# Patient Record
Sex: Female | Born: 1945
Health system: Southern US, Community
[De-identification: ages and names within clinical notes are randomized; demographics above are authoritative.]

## PROBLEM LIST (undated history)

## (undated) DIAGNOSIS — E039 Hypothyroidism, unspecified: Secondary | ICD-10-CM

## (undated) DIAGNOSIS — R55 Syncope and collapse: Secondary | ICD-10-CM

## (undated) DIAGNOSIS — M109 Gout, unspecified: Secondary | ICD-10-CM

## (undated) DIAGNOSIS — E878 Other disorders of electrolyte and fluid balance, not elsewhere classified: Secondary | ICD-10-CM

## (undated) DIAGNOSIS — I10 Essential (primary) hypertension: Secondary | ICD-10-CM

## (undated) DIAGNOSIS — E079 Disorder of thyroid, unspecified: Secondary | ICD-10-CM

## (undated) DIAGNOSIS — E7800 Pure hypercholesterolemia, unspecified: Secondary | ICD-10-CM

## (undated) HISTORY — PX: CHOLECYSTECTOMY: SHX55

## (undated) HISTORY — PX: ABDOMINAL HYSTERECTOMY: SHX81

## (undated) HISTORY — PX: TUBAL LIGATION: SHX77

## (undated) HISTORY — PX: APPENDECTOMY: SHX54

---

## 1998-10-10 ENCOUNTER — Other Ambulatory Visit: Admission: RE | Admit: 1998-10-10 | Discharge: 1998-10-10 | Payer: Self-pay | Admitting: Family Medicine

## 1998-10-30 ENCOUNTER — Ambulatory Visit (HOSPITAL_COMMUNITY): Admission: RE | Admit: 1998-10-30 | Discharge: 1998-10-30 | Payer: Self-pay | Admitting: Gastroenterology

## 2000-07-07 ENCOUNTER — Other Ambulatory Visit: Admission: RE | Admit: 2000-07-07 | Discharge: 2000-07-07 | Payer: Self-pay | Admitting: Family Medicine

## 2001-12-20 ENCOUNTER — Other Ambulatory Visit: Admission: RE | Admit: 2001-12-20 | Discharge: 2001-12-20 | Payer: Self-pay | Admitting: Family Medicine

## 2002-01-04 ENCOUNTER — Encounter: Payer: Self-pay | Admitting: Family Medicine

## 2002-01-04 ENCOUNTER — Ambulatory Visit (HOSPITAL_COMMUNITY): Admission: RE | Admit: 2002-01-04 | Discharge: 2002-01-04 | Payer: Self-pay | Admitting: Family Medicine

## 2002-10-07 ENCOUNTER — Emergency Department (HOSPITAL_COMMUNITY): Admission: EM | Admit: 2002-10-07 | Discharge: 2002-10-07 | Payer: Self-pay | Admitting: Emergency Medicine

## 2003-03-22 ENCOUNTER — Other Ambulatory Visit: Admission: RE | Admit: 2003-03-22 | Discharge: 2003-03-22 | Payer: Self-pay | Admitting: Family Medicine

## 2003-04-11 ENCOUNTER — Encounter: Admission: RE | Admit: 2003-04-11 | Discharge: 2003-04-11 | Payer: Self-pay | Admitting: Family Medicine

## 2003-04-11 ENCOUNTER — Encounter: Payer: Self-pay | Admitting: Family Medicine

## 2004-04-02 ENCOUNTER — Other Ambulatory Visit: Admission: RE | Admit: 2004-04-02 | Discharge: 2004-04-02 | Payer: Self-pay | Admitting: Family Medicine

## 2005-04-28 ENCOUNTER — Other Ambulatory Visit: Admission: RE | Admit: 2005-04-28 | Discharge: 2005-04-28 | Payer: Self-pay | Admitting: Family Medicine

## 2006-09-04 ENCOUNTER — Emergency Department (HOSPITAL_COMMUNITY): Admission: EM | Admit: 2006-09-04 | Discharge: 2006-09-04 | Payer: Self-pay | Admitting: Emergency Medicine

## 2008-10-23 ENCOUNTER — Other Ambulatory Visit: Admission: RE | Admit: 2008-10-23 | Discharge: 2008-10-23 | Payer: Self-pay | Admitting: Family Medicine

## 2009-03-19 ENCOUNTER — Inpatient Hospital Stay (HOSPITAL_COMMUNITY): Admission: EM | Admit: 2009-03-19 | Discharge: 2009-03-20 | Payer: Self-pay | Admitting: Surgery

## 2009-03-19 ENCOUNTER — Encounter: Admission: RE | Admit: 2009-03-19 | Discharge: 2009-03-19 | Payer: Self-pay | Admitting: Family Medicine

## 2009-03-19 ENCOUNTER — Encounter (INDEPENDENT_AMBULATORY_CARE_PROVIDER_SITE_OTHER): Payer: Self-pay | Admitting: Surgery

## 2009-12-03 ENCOUNTER — Ambulatory Visit: Payer: Self-pay | Admitting: Gastroenterology

## 2009-12-03 DIAGNOSIS — E049 Nontoxic goiter, unspecified: Secondary | ICD-10-CM | POA: Insufficient documentation

## 2009-12-03 DIAGNOSIS — R634 Abnormal weight loss: Secondary | ICD-10-CM | POA: Insufficient documentation

## 2009-12-04 ENCOUNTER — Encounter: Payer: Self-pay | Admitting: Gastroenterology

## 2009-12-19 ENCOUNTER — Encounter (HOSPITAL_COMMUNITY): Admission: RE | Admit: 2009-12-19 | Discharge: 2010-01-18 | Payer: Self-pay | Admitting: Family Medicine

## 2009-12-21 DIAGNOSIS — R630 Anorexia: Secondary | ICD-10-CM | POA: Insufficient documentation

## 2010-01-29 LAB — CONVERTED CEMR LAB
Free T4: 0.73 ng/dL — ABNORMAL LOW (ref 0.80–1.80)
TSH: 21.829 microintl units/mL — ABNORMAL HIGH (ref 0.350–4.500)

## 2010-03-05 ENCOUNTER — Encounter (INDEPENDENT_AMBULATORY_CARE_PROVIDER_SITE_OTHER): Payer: Self-pay

## 2010-04-29 ENCOUNTER — Encounter: Admission: RE | Admit: 2010-04-29 | Discharge: 2010-04-29 | Payer: Self-pay | Admitting: Endocrinology

## 2010-05-21 ENCOUNTER — Ambulatory Visit: Payer: Self-pay | Admitting: Gastroenterology

## 2010-05-21 ENCOUNTER — Telehealth (INDEPENDENT_AMBULATORY_CARE_PROVIDER_SITE_OTHER): Payer: Self-pay

## 2010-05-21 DIAGNOSIS — E039 Hypothyroidism, unspecified: Secondary | ICD-10-CM | POA: Insufficient documentation

## 2010-05-30 ENCOUNTER — Ambulatory Visit: Payer: Self-pay | Admitting: Gastroenterology

## 2010-05-30 ENCOUNTER — Ambulatory Visit (HOSPITAL_COMMUNITY)
Admission: RE | Admit: 2010-05-30 | Discharge: 2010-05-30 | Payer: Self-pay | Source: Home / Self Care | Admitting: Gastroenterology

## 2010-09-14 ENCOUNTER — Encounter: Payer: Self-pay | Admitting: Family Medicine

## 2010-09-23 NOTE — Progress Notes (Signed)
Summary: phone note  Phone Note Outgoing Call   Summary of Call: Called pt and LMOM she needs TSH before TCS. Need to know where to fax the order to. Please call. Initial call taken by: Cloria Spring LPN,  May 21, 2010 4:53 PM     Appended Document: phone note tried to call pt- LM asking that she go by [facility] and have labs done  Appended Document: phone note pt aware and will go have lab done

## 2010-09-23 NOTE — Assessment & Plan Note (Signed)
Summary: ANOREXIA, WEIGHT LOSS, FHx: COLON CA-sister   Visit Type:  Initial Consult Referring Provider:  Renaye Rakers Primary Care Provider:  Renaye Rakers, M.D.  Chief Complaint:  TCS/ hemorrhoids.  History of Present Illness: No rectal bleeding, change in bowel habits, or diarrhea. Sister died w/ Colon CA ?<60. Prior TCS: no problems.   Preventive Screening-Counseling & Management  Alcohol-Tobacco     Smoking Status: never  Current Medications (verified): 1)  Crestor .... Take 1 Tablet By Mouth Once A Day 2)  Aleve .... As Needed 3)  Aciphex 20 Mg Tbec (Rabeprazole Sodium) .... Take 1 Tablet By Mouth Once A Day  Allergies (verified): No Known Drug Allergies  Past History:  Past Medical History: GERD Hyperlipidemia Gout  Past Surgical History: Appendectomy age 26 Hysterectomy: DUB Tubal Ligation Cholecystectomy: pain and ?stones-in [place] 2010  Family History: FH of Colon Cancer: sister  CAD, Heart, DM  neg: thyroid problems  Social History: Divoreced: 3 kids, youngest 63 Occupation: Engineer, civil (consulting), [employer] Patient has never smoked.  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  12 LB weight loss over last year. FEB 2011: 129 lbs Decreased appetite since GB surgery.  MAR 2011: CR 0.93, NL HFP, ALB 4.7 HB 12 PLT 313  Vital Signs:  Patient profile:   65 year old female Height:      63 inches Weight:      128 pounds BMI:     22.76 Temp:     98.4 degrees F oral Pulse rate:   72 / minute BP sitting:   120 / 80  (left arm) Cuff size:   regular  Vitals Entered By: Cloria Spring LPN (December 03, 2009 3:27 PM)  Physical Exam  General:  Well developed, well nourished, no acute distress. Head:  Normocephalic and atraumatic. Eyes:  PERRLA, no icterus. Mouth:  No deformity or lesions. Neck:  Supple; no masses, thyromegaly bil lobes, nontender. Lungs:  Clear  throughout to auscultation. Heart:  Regular rate and rhythm; no murmurs. Abdomen:  Soft, nontender and nondistended. Normal bowel sounds. Msk:  Symmetrical with no gross deformities. Normal posture. Extremities:  No edema  noted. Neurologic:  Alert and  oriented x4;  grossly normal neurologically.  Impression & Recommendations:  Problem # 1:  WEIGHT LOSS (ICD-783.21) Anorexia and first degree relative with colon CA. TCS > 10 yrs ago. TCS APR 29-SUPREP. Asked pt to follow up in 2-3 weeks with Dr. Parke Simmers RE: enlarged thyroid, weight loss, and anorexia. Will fax thyroid tests to Dr. Parke Simmers. OPV as needed.  Orders: T-TSH ([id number]) T-Free T4 (23300) Consultation Level III (73220)  CC: PCP

## 2010-09-23 NOTE — Letter (Signed)
Summary: TCS ORDER  TCS ORDER   Imported By: Ave Filter 05/21/2010 16:29:09  _____________________________________________________________________  External Attachment:    Type:   Image     Comment:   External Document

## 2010-09-23 NOTE — Assessment & Plan Note (Signed)
Summary: CRCS, HYPOTHYROIDISM   Visit Type:  Follow-up Visit Primary Care Kumiko Fishman:  Parke Simmers, M.D.  Chief Complaint:  F/U consult for TCS.  History of Present Illness: No rectal bleeding. Feels better and states thyroid is under good control after starting Synthroid. Doesn't know if she was treated for H. pylori. Weight stable: 126-128 lbs.  Current Medications (verified): 1)  Crestor .... Take 1 Tablet By Mouth Once A Day 2)  Aleve .... As Needed 3)  Synthroid .... Take 1 Tablet By Mouth Once A Day  Allergies (verified): No Known Drug Allergies  Past History:  Past Surgical History: Last updated: 12/03/2009 Appendectomy age 68 Hysterectomy: DUB Tubal Ligation Cholecystectomy: pain and ?stones-in [place] 2010  Past Medical History: GERD Hyperlipidemia Gout POS H. PYLORI SEROLOGY, RX: PREVPAC Hypothyroidism, LAST TSH- 23.171 APR 2011, -->Synthroid dose?  Vital Signs:  Patient profile:   65 year old female Height:      63 inches Weight:      126 pounds BMI:     22.40 Temp:     97.9 degrees F oral Pulse rate:   80 / minute BP sitting:   110 / 72  (left arm) Cuff size:   regular  Vitals Entered By: Cloria Spring LPN (May 21, 2010 3:50 PM)  Physical Exam  General:  Well developed, well nourished, no acute distress. Head:  Normocephalic and atraumatic. Lungs:  Clear throughout to auscultation. Heart:  Regular rate and rhythm; no murmurs.  Impression & Recommendations:  Problem # 1:  SCREENING, COLORECTAL CANCER (ICD-V76.51) TCS OCT 7 SUPREP.  Problem # 2:  HYPOTHYROIDISM (ICD-244.9) Pt stated she had thyroid test rechecked after starting Synthroid. Called PCP. No repeat TSH performed. Synthroid dose?  Orders: T-TSH [phone])  Appended Document: Orders Update    Clinical Lists Changes  Orders: Added new Service order of Est. Patient Level II (51884) - Signed

## 2010-09-23 NOTE — Letter (Signed)
Summary: TCS ORDER  TCS ORDER   Imported By: Ave Filter 12/03/2009 16:26:17  _____________________________________________________________________  External Attachment:    Type:   Image     Comment:   External Document

## 2010-09-23 NOTE — Letter (Signed)
Summary: Recall Office Visit  [facility]  [address]   [place], [place] [address]   Phone: [phone]  Fax: [phone]      March 05, 2010   MELYSSA SIGNOR [address] [place], [place]  [address] 1946/05/04   Dear Ms. Keleher,   According to our records, it is time for you to schedule a follow-up office visit with [place].   At your convenience, please call [phone] to schedule an office visit. If you have any questions, concerns, or feel that this letter is in error, we would appreciate your call.   Sincerely,    Hendricks Limes LPN  [facility] Ph: [phone]   Fax: [phone]

## 2010-10-29 ENCOUNTER — Encounter (INDEPENDENT_AMBULATORY_CARE_PROVIDER_SITE_OTHER): Payer: Self-pay | Admitting: *Deleted

## 2010-11-04 NOTE — Letter (Signed)
Summary: Recall Office Visit  [facility]  [address]   [place], [place] [address]   Phone: [phone]  Fax: [phone]      October 29, 2010   SHERELL CHRISTOFFEL [address] [place], [place]  [address] 08-13-1946   Dear Ms. Iannaccone,   According to our records, it is time for you to schedule a follow-up office visit with [place].   At your convenience, please call [phone] to schedule an office visit. If you have any questions, concerns, or feel that this letter is in error, we would appreciate your call.   Sincerely,    Diana Eves  [facility] Ph: [phone]   Fax: [phone]

## 2010-11-05 ENCOUNTER — Other Ambulatory Visit: Payer: Self-pay | Admitting: Family Medicine

## 2010-11-05 ENCOUNTER — Other Ambulatory Visit (HOSPITAL_COMMUNITY)
Admission: RE | Admit: 2010-11-05 | Discharge: 2010-11-05 | Disposition: A | Discharge status: Home / Self Care | Payer: BC Managed Care – PPO | Source: Ambulatory Visit | Attending: Family Medicine | Admitting: Family Medicine

## 2010-11-05 DIAGNOSIS — Z01419 Encounter for gynecological examination (general) (routine) without abnormal findings: Secondary | ICD-10-CM | POA: Insufficient documentation

## 2010-11-13 ENCOUNTER — Other Ambulatory Visit (HOSPITAL_COMMUNITY): Payer: Self-pay | Admitting: Family Medicine

## 2010-11-13 DIAGNOSIS — Z139 Encounter for screening, unspecified: Secondary | ICD-10-CM

## 2010-11-18 ENCOUNTER — Ambulatory Visit (HOSPITAL_COMMUNITY): Payer: BC Managed Care – PPO

## 2010-11-30 LAB — CREATININE, SERUM
Creatinine, Ser: 0.91 mg/dL (ref 0.4–1.2)
GFR calc Af Amer: >60 mL/min (ref >60)

## 2010-11-30 LAB — CBC
MCHC: 34.7 g/dL (ref 30.0–36.0)
RDW: 14.2 % (ref 11.5–15.5)

## 2010-12-02 ENCOUNTER — Other Ambulatory Visit (HOSPITAL_COMMUNITY): Payer: Self-pay | Admitting: Family Medicine

## 2010-12-02 DIAGNOSIS — Z780 Asymptomatic menopausal state: Secondary | ICD-10-CM

## 2010-12-05 ENCOUNTER — Ambulatory Visit (HOSPITAL_COMMUNITY)
Admission: RE | Admit: 2010-12-05 | Discharge: 2010-12-05 | Disposition: A | Discharge status: Home / Self Care | Payer: BC Managed Care – PPO | Source: Ambulatory Visit | Attending: Family Medicine | Admitting: Family Medicine

## 2010-12-05 ENCOUNTER — Encounter (HOSPITAL_COMMUNITY): Payer: Self-pay

## 2010-12-05 DIAGNOSIS — Z1231 Encounter for screening mammogram for malignant neoplasm of breast: Secondary | ICD-10-CM | POA: Insufficient documentation

## 2010-12-05 DIAGNOSIS — M818 Other osteoporosis without current pathological fracture: Secondary | ICD-10-CM | POA: Insufficient documentation

## 2010-12-05 DIAGNOSIS — Z139 Encounter for screening, unspecified: Secondary | ICD-10-CM

## 2010-12-05 DIAGNOSIS — Z780 Asymptomatic menopausal state: Secondary | ICD-10-CM

## 2011-01-06 NOTE — Op Note (Signed)
Jennifer Mullen, Jennifer Mullen NO.:  [id number]   MEDICAL RECORD NO.:  [id number]          PATIENT TYPE:  INP   LOCATION:  1536                         FACILITY:  [facility]   PHYSICIAN:  Ardeth Sportsman, MD     DATE OF BIRTH:  August 07, 1946   DATE OF PROCEDURE:  03/19/2009  DATE OF DISCHARGE:                               OPERATIVE REPORT   PRIMARY CARE:  Renaye Rakers, M.D.   SURGEON:  Karie Soda, M.D.   ASSISTANT:  RN.   PREOPERATIVE DIAGNOSIS:  Acute cholecystitis with cholecystolithiasis.   POSTOPERATIVE DIAGNOSIS:  Acute cholecystitis with cholecystolithiasis.   PROCEDURE PERFORMED:  Laparoscopic cholecystectomy with intraoperative  cholangiogram.   ANESTHESIA:  1. General anesthesia.  2. Local anesthetic in a field block around all port sites.   SPECIMENS:  Gallbladder.   DRAINS:  None.   ESTIMATED BLOOD LOSS:  20 mL.   COMPLICATIONS:  None apparent.   INDICATIONS:  Jennifer Mullen is a 65 year old female who has had a 3-day  history of abdominal pain in the right upper quadrant that has not  abated.  She saw her primary care physician, Dr. Parke Simmers, who did a CAT  scan and physical exam was concerning for cholecystitis.  She requested  surgical admission.   The anatomy and physiology of hepatobiliary and pancreatic function was  discussed.  Pathophysiology of cholecystitis was discussed.  Options  discussed and recommendation made for laparoscopic cholecystectomy with  intraoperative cholangiogram.  Risks, benefits, and alternatives  discussed.  Questions answered.  She agreed to proceed.   OPERATIVE FINDINGS:  She had a turgid gallbladder that is consistent  with early acute cholecystitis.  She had some mild gallbladder wall  thickening.  Her cystic duct was rather tortuous.  Her cholangiogram  showed no evidence of obstruction.  She had some mild liver adhesions  but no other major problems.   DESCRIPTION OF PROCEDURE:  Informed consent was confirmed.  The  patient  was already on IV Unasyn.  She voided prior to coming to the operating  room.  She underwent general anesthesia without any difficulty.  She was  positioned supine with arms out.  Her abdomen was prepped and draped in  sterile fashion.  Surgical time-out confirmed our plan.   A number 5 mm port was placed in the right upper quadrant using optical  entry technique with the patient in steep reversed Trendelenburg and  right-side up.  Camera inspection revealed no intra-abdominal injury.  Under direct visualization, 5 mm ports placed in the subxiphoid region  and right flank.  A 10 mm port was placed through the umbilicus.   Camera inspection revealed no intra-abdominal injury.  The gallbladder  was too distended to safely be grasped.  Therefore I did needle  decompression and aspirated some bile with slightly milky.  The  gallbladder could better be grasped, elevated cephalad.  Peritoneal  coverage between the gallbladder and liver were freed on the anterior  anteromedial and posterolateral walls.  Circumferential dissection was  done to free the proximal third of the gallbladder off the liver bed to  get  a good critical view.  The anterior-posterior branches of the cystic  artery could be seen climbing up on the gallbladder wall.  These were  carefully skeletonized and clipped with one clip on the gallbladder side  and two clips slightly proximal.  Further skeletonization was done until  only the infundibulum and cystic duct was seen.  She had somewhat of a  tortuous short cystic duct but with careful skeletonization and  dissection I was able to straighten out the cystic duct better.  Two  clips were placed in the infundibulum and a partial cystic ductotomy was  performed.  There was a moderate medium sized stone right at the  infundibulum.  I opened up the ductotomy little bit more and got the  stone out.  The clips in the infundibulum fell off so I removed them.   A #5  cholangiogram catheter was passed through a subcostal puncture site  and into the cystic duct.  Cholangiogram was run using dilute radio-  opaque contrast and continuous fluoroscopy.  Contrast flowed through a  short slightly dilated cystic duct.  It refluxed up the common hepatic  duct into right and left intrahepatic chains and came down the short  common bile duct across a normal ampulla into the duodenum without any  difficulty.  This was consistent with a classic anatomy with otherwise  normal cholangiogram.   The cholangiocatheter was removed.  I placed five clips on the cystic  duct since a couple did get completely across but I had at least three  good ones going across it.  A cystic duct transection was completed.  The gallbladder was freed from its remaining attachments on the liver  bed and I removed it inside an EndoCatch bag and pulled it out the  umbilicus with some gentle dilation.  She had obvious stones.  Gallbladder was moderately thickened so it was hard to get it completely  out.   I did camera inspection and made sure hemostasis was noted on the liver  bed.  Clips were intact in the cystic duct and arterial stumps.  Copious  irrigation of a liter was done with clear return at the end.  Capnoperitoneum was evacuated and ports removed.  The umbilical fascial  defect was closed using 0-0 Vicryl interrupted stitches.  A finger sweep  made sure there was no breech any intra-abdominal contents before tying  stitches down.  Skin was closed using 4-0 Monocryl stitch.  Sterile  dressings applied.   The patient extubated and sent to recovery room in stable condition.  I  discussed postop care with the patient and her friend as well.  I  discussed it with the patient's friend after surgery as well and she  expressed understanding and appreciation.      Ardeth Sportsman, MD  Electronically Signed     SCG/MEDQ  D:  03/19/2009  T:  03/20/2009  Job:  [id number]   cc:   Renaye Rakers, M.D.  Fax: [phone]

## 2011-01-06 NOTE — H&P (Signed)
Jennifer Mullen, Jennifer Mullen NO.:  [id number]   MEDICAL RECORD NO.:  [id number]          PATIENT TYPE:  INP   LOCATION:  1536                         FACILITY:  [facility]   PHYSICIAN:  Ardeth Sportsman, MD     DATE OF BIRTH:  19-Oct-1945   DATE OF ADMISSION:  03/19/2009  DATE OF DISCHARGE:                              HISTORY & PHYSICAL   PRIMARY CARE PHYSICIAN:  Renaye Rakers, M.D.   SURGEON:  Ardeth Sportsman, MD.   REASON FOR ADMISSION:  Cholecystitis.   HISTORY OF PRESENT ILLNESS:  Jennifer Mullen is a 65 year old female with  some moderate health issues that are relatively stable.  She awoke 3  days ago with severe upper abdominal pain primarily in the right upper  quadrant.  It woke her up in the middle of the night.  She had some  fried chicken that night.  She felt nauseated but did not throw up.  The  pain was very intense for several hours and eventually decreased.  However, the pain never totally went away.  The pain is constant.  It is  in the right upper quadrant.  It does not seem to radiate.  She has some  continued intermittent nausea especially with eating.  It does not seem  consistent with heartburn or reflux.  It is not exertional.  There is no  hematemesis, hematochezia or melena.  She has a strong family history  for colon cancer but has never had a colonoscopy.  She normally has a  bowel movement every day.  No sick contacts or travel history.  She has  moderate physical activity and no exertional chest pain or angina.   Because her pain worsened she saw her primary care physician.  Dr. Parke Simmers  was concerned enough to get a CAT scan which showed evidence of early  cholecystitis.  She called me and requested that I admit the patient and  take care of her.   PAST MEDICAL HISTORY:  1. Hypertension.  2. Hypercholesterolemia.  3. Low back pain.   PAST SURGICAL HISTORY:  1. She had tubal ligation in the distant past.  2. She has had abdominal  hysterectomy.  3. She has had an appendectomy.  4. She has had a release of carpal tunnel.   MEDICATIONS:  She thinks she takes Aleve.  She also takes some other  anti-inflammatory medication which she cannot recall.  She thinks she  takes Zocor but cannot recall.  She did not bring her medications here.   ALLERGIES:  No known drug allergies.   SOCIAL HISTORY:  No tobacco, alcohol or drug use.  She works in a plant,  is rather busy.  She is here today with her boss who I think is a family  member as well.   FAMILY HISTORY:  Hypertension is quite prominent as well as  hypercholesterolemia.  She had one sister die of colon cancer in her  20s.   REVIEW OF SYSTEMS:  Noted per HPI.  No weight gain or weight loss.  No  fevers, chills, sweats.  EYES:  No vision  changes or double vision.  ENT:  Negative.  CARDIAC:  She will get some leg cramping if she goes up  a flight of stairs but otherwise can walk an hour without much  difficulty.  No angina.  No coronary disease or heart attacks.  No  history of heart failure.  PULMONARY:  No recent cold, coughs or flus.  She did have a flu last season but nothing recent.  No asthma.  HEPATIC,  RENAL, ENDOCRINE:  Otherwise negative.  MUSCULOSKELETAL:  Some mild low  back pain controlled with oral pain medicines, not severe.  No history  of trauma.  PSYCH:  Negative.  NEURO:  Negative.  DERMATOLOGIC,  BREASTS:  Negative.  GYN:  Negative.   PHYSICAL EXAM:  VITAL SIGNS:  She is afebrile with vital signs within  normal range as noted in the chart.  IN GENERAL:  She is a well-developed, well-nourished female slightly  uncomfortable, not toxic.  PSYCH:  She is pleasant, interactive.  There is no evidence of any  dementia, delirium, psychosis or paranoia.  She is awake, alert and  oriented x4.  EYES:  Pupils equal, round and reactive to light.  Extraocular movements  intact.  Sclerae nonicteric or injected.  NEUROLOGICAL:  Cranial nerves II-XII are  intact.  Hand grip is 5/5 equal  and symmetrical.  No resting or intention tremors.  No focal deficits.  ENT:  She is normocephalic.  Mucous membranes are moist.  Nasopharynx,  oropharynx clear.  NECK:  Supple without any masses.  Trachea is midline.  HEART:  Regular rate and rhythm.  No murmurs, gallops or rubs.  Normal  dorsalis pedis and radial pulses.  No carotid bruits.  LUNGS:  Clear to auscultation bilaterally.  No wheezes, rales, rhonchi.  ABDOMEN:  Soft, slightly overweight but not distended.  She has obvious  tenderness in the right upper quadrant with guarding and a positive  Murphy's sign.  The rest of her abdomen is soft.  There is no umbilical  hernia.  GENITAL:  Normal external female genitalia.  No inguinal hernias.  RECTAL EXAMINATION:  Deferred.  EXTREMITIES: No clubbing, cyanosis or edema.  MUSCULOSKELETAL:  Full range of motion at shoulders, elbows, wrists as  well as hips, knees and ankles.  Back: Normal range of motion of  cervical, thoracolumbosacral spine.  No pain on costophrenic angles or  in her lower spine.  SKIN:  No petechia.  No purpura.  No other sores or  lesions.  LYMPH:  No head, neck, axillary or groin lymphadenopathy.   LABORATORY VALUES:  Her white count is in normal range.  Her chemistries  are normal as well.  She does have a CT scan of the abdomen only.  She  has calcified stones in her gallbladder.  She has some mild gallbladder  wall thickening and the gallbladder is distended.  There is no obvious  bowel obstruction.  I do not have a pelvic view.   ASSESSMENT/PLAN:  A 65 year old female with history and physical  concerning for early acute cholecystitis despite a normal white count.  1. Anatomy and physiology of hepatobiliary and pancreatic function was      discussed.  Pathophysiology of cholecystitis with its natural      history and risks were discussed.  Options discussed and      recommendations made for laparoscopic cholecystectomy  with intra-      operative cholangiogram.  Risks, benefits and alternans discussed.      Questions answered.  She agreed to proceed.  Given the fact that      she has had persistent pain that seems a little worse and some      nausea and she even has a Murphy's sign, I do not think this can      wait much longer.  She does not look toxic and otherwise hopefully      she will tolerate this well.  2. Intravenous Unasyn antibiotics.  3. Sequential compression devices prophylaxis.  4. Proton pump inhibitor if she does not improve.  5. Colonoscopy.  I strongly stressed this to her given her family      history of a family member dying of colon cancer only in her 52s      and the fact hat she is over 89 and never had a screening      colonoscopy.  I recommend that she get a colonoscopy to make sure      she did not have any other abnormalities.  She is at risk for this      and it would be best to catch it early with just some      polypectomies.  She will think about it.  6. Blood pressure control with metoprolol for right now.  I do not      know what medication she is on and need her to get her home      medications to come in.      Ardeth Sportsman, MD  Electronically Signed     SCG/MEDQ  D:  03/19/2009  T:  03/19/2009  Job:  [id number]   cc:   Renaye Rakers, M.D.  Fax: [phone]

## 2012-05-10 ENCOUNTER — Other Ambulatory Visit: Payer: Self-pay | Admitting: Family Medicine

## 2012-05-10 ENCOUNTER — Other Ambulatory Visit (HOSPITAL_COMMUNITY)
Admission: RE | Admit: 2012-05-10 | Discharge: 2012-05-10 | Disposition: A | Discharge status: Home / Self Care | Payer: Medicare Other | Source: Ambulatory Visit | Attending: Family Medicine | Admitting: Family Medicine

## 2012-05-10 DIAGNOSIS — Z01419 Encounter for gynecological examination (general) (routine) without abnormal findings: Secondary | ICD-10-CM | POA: Insufficient documentation

## 2012-05-11 ENCOUNTER — Other Ambulatory Visit (HOSPITAL_COMMUNITY): Payer: Self-pay | Admitting: Family Medicine

## 2012-05-11 DIAGNOSIS — Z139 Encounter for screening, unspecified: Secondary | ICD-10-CM

## 2012-05-17 ENCOUNTER — Ambulatory Visit (HOSPITAL_COMMUNITY)
Admission: RE | Admit: 2012-05-17 | Discharge: 2012-05-17 | Disposition: A | Discharge status: Home / Self Care | Payer: Medicare Other | Source: Ambulatory Visit | Attending: Family Medicine | Admitting: Family Medicine

## 2012-05-17 DIAGNOSIS — Z139 Encounter for screening, unspecified: Secondary | ICD-10-CM

## 2012-05-17 DIAGNOSIS — Z1231 Encounter for screening mammogram for malignant neoplasm of breast: Secondary | ICD-10-CM | POA: Insufficient documentation

## 2013-06-08 ENCOUNTER — Other Ambulatory Visit (HOSPITAL_COMMUNITY)
Admission: RE | Admit: 2013-06-08 | Discharge: 2013-06-08 | Disposition: A | Discharge status: Home / Self Care | Payer: Medicare Other | Source: Ambulatory Visit | Attending: Family Medicine | Admitting: Family Medicine

## 2013-06-08 ENCOUNTER — Other Ambulatory Visit: Payer: Self-pay | Admitting: Family Medicine

## 2013-06-08 DIAGNOSIS — Z124 Encounter for screening for malignant neoplasm of cervix: Secondary | ICD-10-CM | POA: Insufficient documentation

## 2013-06-12 ENCOUNTER — Other Ambulatory Visit (HOSPITAL_COMMUNITY): Payer: Self-pay | Admitting: Family Medicine

## 2013-06-12 DIAGNOSIS — Z139 Encounter for screening, unspecified: Secondary | ICD-10-CM

## 2013-06-20 ENCOUNTER — Ambulatory Visit (HOSPITAL_COMMUNITY): Payer: Medicare Other

## 2013-06-29 ENCOUNTER — Ambulatory Visit (HOSPITAL_COMMUNITY)
Admission: RE | Admit: 2013-06-29 | Discharge: 2013-06-29 | Disposition: A | Discharge status: Home / Self Care | Payer: Medicare Other | Source: Ambulatory Visit | Attending: Family Medicine | Admitting: Family Medicine

## 2013-06-29 DIAGNOSIS — Z139 Encounter for screening, unspecified: Secondary | ICD-10-CM

## 2013-06-29 DIAGNOSIS — Z1231 Encounter for screening mammogram for malignant neoplasm of breast: Secondary | ICD-10-CM | POA: Insufficient documentation

## 2014-05-12 ENCOUNTER — Emergency Department (HOSPITAL_COMMUNITY)
Admission: EM | Admit: 2014-05-12 | Discharge: 2014-05-12 | Disposition: A | Discharge status: Home / Self Care | Payer: Medicare HMO | Attending: Emergency Medicine | Admitting: Emergency Medicine

## 2014-05-12 ENCOUNTER — Encounter (HOSPITAL_COMMUNITY): Payer: Self-pay | Admitting: Emergency Medicine

## 2014-05-12 DIAGNOSIS — E7800 Pure hypercholesterolemia, unspecified: Secondary | ICD-10-CM | POA: Diagnosis not present

## 2014-05-12 DIAGNOSIS — R55 Syncope and collapse: Secondary | ICD-10-CM | POA: Diagnosis not present

## 2014-05-12 DIAGNOSIS — I10 Essential (primary) hypertension: Secondary | ICD-10-CM | POA: Diagnosis not present

## 2014-05-12 DIAGNOSIS — R404 Transient alteration of awareness: Secondary | ICD-10-CM | POA: Diagnosis present

## 2014-05-12 DIAGNOSIS — Z79899 Other long term (current) drug therapy: Secondary | ICD-10-CM | POA: Insufficient documentation

## 2014-05-12 DIAGNOSIS — E079 Disorder of thyroid, unspecified: Secondary | ICD-10-CM | POA: Insufficient documentation

## 2014-05-12 HISTORY — DX: Disorder of thyroid, unspecified: E07.9

## 2014-05-12 HISTORY — DX: Essential (primary) hypertension: I10

## 2014-05-12 HISTORY — DX: Pure hypercholesterolemia, unspecified: E78.00

## 2014-05-12 HISTORY — DX: Syncope and collapse: R55

## 2014-05-12 LAB — CBC WITH DIFFERENTIAL/PLATELET
BASOS PCT: 1 % (ref 0–1)
Basophils Absolute: 0.1 10*3/uL (ref 0.0–0.1)
Eosinophils Absolute: 0.3 10*3/uL (ref 0.0–0.7)
Eosinophils Relative: 4 % (ref 0–5)
HEMATOCRIT: 38.9 % (ref 36.0–46.0)
HEMOGLOBIN: 13 g/dL (ref 12.0–15.0)
LYMPHS PCT: 29 % (ref 12–46)
Lymphs Abs: 1.9 10*3/uL (ref 0.7–4.0)
MCH: 28 pg (ref 26.0–34.0)
MCHC: 33.4 g/dL (ref 30.0–36.0)
MCV: 83.8 fL (ref 78.0–100.0)
MONO ABS: 0.5 10*3/uL (ref 0.1–1.0)
MONOS PCT: 8 % (ref 3–12)
NEUTROS ABS: 3.8 10*3/uL (ref 1.7–7.7)
NEUTROS PCT: 58 % (ref 43–77)
Platelets: 238 10*3/uL (ref 150–400)
RBC: 4.64 MIL/uL (ref 3.87–5.11)
RDW: 13.7 % (ref 11.5–15.5)
WBC: 6.6 10*3/uL (ref 4.0–10.5)

## 2014-05-12 LAB — COMPREHENSIVE METABOLIC PANEL
ALBUMIN: 4.3 g/dL (ref 3.5–5.2)
ALK PHOS: 114 U/L (ref 39–117)
ALT: 14 U/L (ref 0–35)
ANION GAP: 14 (ref 5–15)
AST: 20 U/L (ref 0–37)
BILIRUBIN TOTAL: 0.4 mg/dL (ref 0.3–1.2)
BUN: 23 mg/dL (ref 6–23)
CHLORIDE: 103 meq/L (ref 96–112)
CO2: 24 meq/L (ref 19–32)
CREATININE: 1.22 mg/dL — AB (ref 0.50–1.10)
Calcium: 9.1 mg/dL (ref 8.4–10.5)
GFR calc Af Amer: 52 mL/min — ABNORMAL LOW (ref >90)
GFR, EST NON AFRICAN AMERICAN: 45 mL/min — AB (ref >90)
Glucose, Bld: 104 mg/dL — ABNORMAL HIGH (ref 70–99)
POTASSIUM: 4.3 meq/L (ref 3.7–5.3)
Sodium: 141 mEq/L (ref 137–147)
Total Protein: 7.3 g/dL (ref 6.0–8.3)

## 2014-05-12 LAB — TROPONIN I: Troponin I: <0.3 ng/mL (ref <0.30)

## 2014-05-12 NOTE — ED Notes (Addendum)
Pt arrives with c/o syncope episodes x3 today, family states she was unresponsive for about 10 minutes. Denies pain. Family states sister is at [facility] post stroke, prognosis is not good.

## 2014-05-12 NOTE — ED Notes (Signed)
Pt was able to complete orthostatic vital signs with no assistance needed.  

## 2014-05-12 NOTE — ED Notes (Signed)
Pt.'s family reported that she passed out 3 times this evening while visiting her sister at ICU that is critically ill this evening , teary at triage , reports emotionally distressed about sister's condition . Speech clear , no facial asymmetry , no arm drift . Alert and oriented . Denies pain /respiurations unlabored.

## 2014-05-12 NOTE — ED Notes (Signed)
Pt placed on monitor upon arrival to room. Pt monitored by blood pressure, pulse ox, and 5 lead.  

## 2014-05-12 NOTE — ED Provider Notes (Signed)
TIME SEEN: 11:00 PM  CHIEF COMPLAINT: Syncope  HPI:  Patient is a 68 y.o. F history of hypertension no longer on medication, hyperlipidemia, hypothyroidism, prior episodes of syncope when emotionally distressed who presents emergency department after 3 syncopal episodes. She reports her sister is in the ICU is critically ill with any intracranial hemorrhage. She states that the physicians were discussing the patient's sisters poor prognosis when the patient passed out. She states that this has happened to her multiple times before especially at funerals. She states she was sitting down when she passed out and did not hit her head. No seizure activity, tongue biting or incontinence. No preceding chest pain, shortness of breath, palpitations or lightheadedness. No recent vomiting or diarrhea, bloody stools or melena,  vaginal bleeding. She denies a history of CAD, CHF or arrhythmia. No numbness, tingling or focal weakness.  ROS: See HPI Constitutional: no fever  Eyes: no drainage  ENT: no runny nose   Cardiovascular:  no chest pain  Resp: no SOB  GI: no vomiting GU: no dysuria Integumentary: no rash  Allergy: no hives  Musculoskeletal: no leg swelling  Neurological: no slurred speech ROS otherwise negative  PAST MEDICAL HISTORY/PAST SURGICAL HISTORY:  Past Medical History  Diagnosis Date  . Hypertension   . Thyroid disease   . Hypercholesteremia   . Syncope     MEDICATIONS:  Prior to Admission medications   Medication Sig Start Date End Date Taking? Authorizing Provider  CRESTOR 20 MG tablet Take 1 tablet by mouth daily. 03/26/14   Historical Provider, MD  SYNTHROID 88 MCG tablet Take 1 tablet by mouth every other day. 05/03/14   Historical Provider, MD    ALLERGIES:  No Known Allergies  SOCIAL HISTORY:  History  Substance Use Topics  . Smoking status: Never Smoker   . Smokeless tobacco: Not on file  . Alcohol Use: No    FAMILY HISTORY: No family history on  file.  EXAM: BP 146/68  Pulse 64  Temp(Src) 97.4 F (36.3 C) (Oral)  Resp 15  Ht  (1.6 m)  Wt 132 lb (59.875 kg)  BMI 23.39 kg/m2  SpO2 97% CONSTITUTIONAL: Alert and oriented and responds appropriately to questions. Well-appearing; well-nourished HEAD: Normocephalic EYES: Conjunctivae clear, PERRL ENT: normal nose; no rhinorrhea; moist mucous membranes; pharynx without lesions noted NECK: Supple, no meningismus, no LAD  CARD: RRR; S1 and S2 appreciated; no murmurs, no clicks, no rubs, no gallops RESP: Normal chest excursion without splinting or tachypnea; breath sounds clear and equal bilaterally; no wheezes, no rhonchi, no rales, no hypoxia or respiratory distress, speaking full sentences ABD/GI: Normal bowel sounds; non-distended; soft, non-tender, no rebound, no guarding BACK:  The back appears normal and is non-tender to palpation, there is no CVA tenderness EXT: Normal ROM in all joints; non-tender to palpation; no edema; normal capillary refill; no cyanosis    SKIN: Normal color for age and race; warm NEURO: Moves all extremities equally, cranial nerves II through XII intact, sensation to light touch intact diffusely PSYCH: The patient's mood and manner are appropriate. Grooming and personal hygiene are appropriate. Patient is tearful.  MEDICAL DECISION MAKING: Patient here with a syncopal event x3 in the setting of receiving information regarding her sister's poor prognosis in ICU. She has had episodes of syncope multiple times in the past when she is emotionally distressed. Denies any preceding symptoms. She has no history of CHF, A. fib or other arrhythmia, CAD. Labs are unremarkable. Troponin negative. Orthostatic vital signs  negative. She states she would like to be discharged so that she can go back and be with her sister. I feel she is safe to be discharged. Have advised her to followup with her primary care physician as needed. Have discussed return precautions. She  verbalized understanding and is comfortable with plan.        EKG Interpretation  Date/Time:  Saturday May 12 2014 21:58:44 EDT Ventricular Rate:  68 PR Interval:  166 QRS Duration: 74 QT Interval:  428 QTC Calculation: 455 R Axis:   65 Text Interpretation:  Normal sinus rhythm Septal infarct , age undetermined Abnormal ECG No significant change since last tracing in 2010 Confirmed by WARD,  DO, KRISTEN [phone]) on 05/12/2014 10:44:55 PM        Layla Maw Ward, DO 05/12/14 [id number]

## 2014-05-12 NOTE — Discharge Instructions (Signed)
Vasovagal Syncope, Adult °Syncope, commonly known as fainting, is a temporary loss of consciousness. It occurs when the blood flow to the brain is reduced. Vasovagal syncope (also called neurocardiogenic syncope) is a fainting spell in which the blood flow to the brain is reduced because of a sudden drop in heart rate and blood pressure. Vasovagal syncope occurs when the brain and the cardiovascular system (blood vessels) do not adequately communicate and respond to each other. This is the most common cause of fainting. It often occurs in response to fear or some other type of emotional or physical stress. The body has a reaction in which the heart starts beating too slowly or the blood vessels expand, reducing blood pressure. This type of fainting spell is generally considered harmless. However, injuries can occur if a person takes a sudden fall during a fainting spell.  °CAUSES  °Vasovagal syncope occurs when a person's blood pressure and heart rate decrease suddenly, usually in response to a trigger. Many things and situations can trigger an episode. Some of these include:  °· Pain.   °· Fear.   °· The sight of blood or medical procedures, such as blood being drawn from a vein.   °· Common activities, such as coughing, swallowing, stretching, or going to the bathroom.   °· Emotional stress.   °· Prolonged standing, especially in a warm environment.   °· Lack of sleep or rest.   °· Prolonged lack of food.   °· Prolonged lack of fluids.   °· Recent illness. °· The use of certain drugs that affect blood pressure, such as cocaine, alcohol, marijuana, inhalants, and opiates.   °SYMPTOMS  °Before the fainting episode, you may:  °· Feel dizzy or light headed.   °· Become pale. °· Sense that you are going to faint.   °· Feel like the room is spinning.   °· Have tunnel vision, only seeing directly in front of you.   °· Feel sick to your stomach (nauseous).   °· See spots or slowly lose vision.   °· Hear ringing in your  ears.   °· Have a headache.   °· Feel warm and sweaty.   °· Feel a sensation of pins and needles. °During the fainting spell, you will generally be unconscious for no longer than a couple minutes before waking up and returning to normal. If you get up too quickly before your body can recover, you may faint again. Some twitching or jerky movements may occur during the fainting spell.  °DIAGNOSIS  °Your caregiver will ask about your symptoms, take a medical history, and perform a physical exam. Various tests may be done to rule out other causes of fainting. These may include blood tests and tests to check the heart, such as electrocardiography, echocardiography, and possibly an electrophysiology study. When other causes have been ruled out, a test may be done to check the body's response to changes in position (tilt table test). °TREATMENT  °Most cases of vasovagal syncope do not require treatment. Your caregiver may recommend ways to avoid fainting triggers and may provide home strategies for preventing fainting. If you must be exposed to a possible trigger, you can drink additional fluids to help reduce your chances of having an episode of vasovagal syncope. If you have warning signs of an oncoming episode, you can respond by positioning yourself favorably (lying down). °If your fainting spells continue, you may be given medicines to prevent fainting. Some medicines may help make you more resistant to repeated episodes of vasovagal syncope. Special exercises or compression stockings may be recommended. In rare cases, the surgical placement   of a pacemaker is considered. °HOME CARE INSTRUCTIONS  °· Learn to identify the warning signs of vasovagal syncope.   °· Sit or lie down at the first warning sign of a fainting spell. If sitting, put your head down between your legs. If you lie down, swing your legs up in the air to increase blood flow to the brain.   °· Avoid hot tubs and saunas. °· Avoid prolonged  standing. °· Drink enough fluids to keep your urine clear or pale yellow. Avoid caffeine. °· Increase salt in your diet as directed by your caregiver.   °· If you have to stand for a long time, perform movements such as:   °¨ Crossing your legs.   °¨ Flexing and stretching your leg muscles.   °¨ Squatting.   °¨ Moving your legs.   °¨ Bending over.   °· Only take over-the-counter or prescription medicines as directed by your caregiver. Do not suddenly stop any medicines without asking your caregiver first.  °SEEK MEDICAL CARE IF:  °· Your fainting spells continue or happen more frequently in spite of treatment.   °· You lose consciousness for more than a couple minutes. °· You have fainting spells during or after exercising or after being startled.   °· You have new symptoms that occur with the fainting spells, such as:   °¨ Shortness of breath. °¨ Chest pain.   °¨ Irregular heartbeat.   °· You have episodes of twitching or jerky movements that last longer than a few seconds. °· You have episodes of twitching or jerky movements without obvious fainting. °SEEK IMMEDIATE MEDICAL CARE IF:  °· You have injuries or bleeding after a fainting spell.   °· You have episodes of twitching or jerky movements that last longer than 5 minutes.   °· You have more than one spell of twitching or jerky movements before returning to consciousness after fainting. °MAKE SURE YOU:  °· Understand these instructions. °· Will watch your condition. °· Will get help right away if you are not doing well or get worse. °Document Released: 07/27/2012 Document Reviewed: 07/27/2012 °[employer]® Patient Information ©2015 [employer]. This information is not intended to replace advice given to you by your health care provider. Make sure you discuss any questions you have with your health care provider. ° °

## 2014-06-20 ENCOUNTER — Other Ambulatory Visit (HOSPITAL_COMMUNITY): Payer: Self-pay | Admitting: Family Medicine

## 2014-06-20 DIAGNOSIS — Z1231 Encounter for screening mammogram for malignant neoplasm of breast: Secondary | ICD-10-CM

## 2014-07-02 ENCOUNTER — Ambulatory Visit (HOSPITAL_COMMUNITY)
Admission: RE | Admit: 2014-07-02 | Discharge: 2014-07-02 | Disposition: A | Discharge status: Home / Self Care | Payer: Medicare HMO | Source: Ambulatory Visit | Attending: Family Medicine | Admitting: Family Medicine

## 2014-07-02 DIAGNOSIS — R928 Other abnormal and inconclusive findings on diagnostic imaging of breast: Secondary | ICD-10-CM | POA: Diagnosis not present

## 2014-07-02 DIAGNOSIS — Z1231 Encounter for screening mammogram for malignant neoplasm of breast: Secondary | ICD-10-CM

## 2014-07-04 ENCOUNTER — Other Ambulatory Visit: Payer: Self-pay | Admitting: Family Medicine

## 2014-07-04 DIAGNOSIS — R928 Other abnormal and inconclusive findings on diagnostic imaging of breast: Secondary | ICD-10-CM

## 2014-07-17 ENCOUNTER — Ambulatory Visit (HOSPITAL_COMMUNITY)
Admission: RE | Admit: 2014-07-17 | Discharge: 2014-07-17 | Disposition: A | Discharge status: Home / Self Care | Payer: Medicare HMO | Source: Ambulatory Visit | Attending: Family Medicine | Admitting: Family Medicine

## 2014-07-17 DIAGNOSIS — R928 Other abnormal and inconclusive findings on diagnostic imaging of breast: Secondary | ICD-10-CM | POA: Insufficient documentation

## 2014-09-12 DIAGNOSIS — I10 Essential (primary) hypertension: Secondary | ICD-10-CM | POA: Diagnosis not present

## 2014-09-12 DIAGNOSIS — K209 Esophagitis, unspecified: Secondary | ICD-10-CM | POA: Diagnosis not present

## 2014-09-12 DIAGNOSIS — Z0000 Encounter for general adult medical examination without abnormal findings: Secondary | ICD-10-CM | POA: Diagnosis not present

## 2014-09-12 DIAGNOSIS — E039 Hypothyroidism, unspecified: Secondary | ICD-10-CM | POA: Diagnosis not present

## 2014-11-01 DIAGNOSIS — H2510 Age-related nuclear cataract, unspecified eye: Secondary | ICD-10-CM | POA: Diagnosis not present

## 2014-12-12 DIAGNOSIS — E039 Hypothyroidism, unspecified: Secondary | ICD-10-CM | POA: Diagnosis not present

## 2014-12-12 DIAGNOSIS — I10 Essential (primary) hypertension: Secondary | ICD-10-CM | POA: Diagnosis not present

## 2015-04-11 DIAGNOSIS — F4323 Adjustment disorder with mixed anxiety and depressed mood: Secondary | ICD-10-CM | POA: Diagnosis not present

## 2015-04-11 DIAGNOSIS — T382X6D Underdosing of antithyroid drugs, subsequent encounter: Secondary | ICD-10-CM | POA: Diagnosis not present

## 2015-04-11 DIAGNOSIS — E039 Hypothyroidism, unspecified: Secondary | ICD-10-CM | POA: Diagnosis not present

## 2015-04-11 DIAGNOSIS — I10 Essential (primary) hypertension: Secondary | ICD-10-CM | POA: Diagnosis not present

## 2015-04-11 DIAGNOSIS — M150 Primary generalized (osteo)arthritis: Secondary | ICD-10-CM | POA: Diagnosis not present

## 2015-04-30 ENCOUNTER — Encounter: Payer: Self-pay | Admitting: Gastroenterology

## 2015-08-13 ENCOUNTER — Other Ambulatory Visit (HOSPITAL_COMMUNITY): Payer: Self-pay | Admitting: Family Medicine

## 2015-08-13 DIAGNOSIS — F4323 Adjustment disorder with mixed anxiety and depressed mood: Secondary | ICD-10-CM | POA: Diagnosis not present

## 2015-08-13 DIAGNOSIS — I10 Essential (primary) hypertension: Secondary | ICD-10-CM | POA: Diagnosis not present

## 2015-08-13 DIAGNOSIS — Z780 Asymptomatic menopausal state: Secondary | ICD-10-CM

## 2015-08-13 DIAGNOSIS — M150 Primary generalized (osteo)arthritis: Secondary | ICD-10-CM | POA: Diagnosis not present

## 2015-08-13 DIAGNOSIS — Z1231 Encounter for screening mammogram for malignant neoplasm of breast: Secondary | ICD-10-CM

## 2015-08-13 DIAGNOSIS — E039 Hypothyroidism, unspecified: Secondary | ICD-10-CM | POA: Diagnosis not present

## 2015-08-28 ENCOUNTER — Ambulatory Visit (HOSPITAL_COMMUNITY)
Admission: RE | Admit: 2015-08-28 | Discharge: 2015-08-28 | Disposition: A | Discharge status: Home / Self Care | Payer: Commercial Managed Care - HMO | Source: Ambulatory Visit | Attending: Family Medicine | Admitting: Family Medicine

## 2015-08-28 ENCOUNTER — Ambulatory Visit (HOSPITAL_COMMUNITY): Payer: Medicare HMO

## 2015-08-28 DIAGNOSIS — Z1231 Encounter for screening mammogram for malignant neoplasm of breast: Secondary | ICD-10-CM | POA: Diagnosis not present

## 2015-08-28 DIAGNOSIS — M8588 Other specified disorders of bone density and structure, other site: Secondary | ICD-10-CM | POA: Diagnosis not present

## 2015-08-28 DIAGNOSIS — Z780 Asymptomatic menopausal state: Secondary | ICD-10-CM | POA: Diagnosis not present

## 2015-09-20 DIAGNOSIS — E032 Hypothyroidism due to medicaments and other exogenous substances: Secondary | ICD-10-CM | POA: Diagnosis not present

## 2015-11-11 DIAGNOSIS — M150 Primary generalized (osteo)arthritis: Secondary | ICD-10-CM | POA: Diagnosis not present

## 2015-11-11 DIAGNOSIS — I10 Essential (primary) hypertension: Secondary | ICD-10-CM | POA: Diagnosis not present

## 2015-11-11 DIAGNOSIS — T382X5D Adverse effect of antithyroid drugs, subsequent encounter: Secondary | ICD-10-CM | POA: Diagnosis not present

## 2015-11-29 DIAGNOSIS — R799 Abnormal finding of blood chemistry, unspecified: Secondary | ICD-10-CM | POA: Diagnosis not present

## 2015-11-29 DIAGNOSIS — E039 Hypothyroidism, unspecified: Secondary | ICD-10-CM | POA: Diagnosis not present

## 2015-12-23 DIAGNOSIS — R946 Abnormal results of thyroid function studies: Secondary | ICD-10-CM | POA: Diagnosis not present

## 2015-12-27 DIAGNOSIS — Z6821 Body mass index (BMI) 21.0-21.9, adult: Secondary | ICD-10-CM | POA: Diagnosis not present

## 2015-12-27 DIAGNOSIS — E039 Hypothyroidism, unspecified: Secondary | ICD-10-CM | POA: Diagnosis not present

## 2016-01-25 IMAGING — MG MM DIGITAL SCREENING
8 series · 8 of 24 positions shown · non-contrast
Comparison: Previous exam(s).

CLINICAL DATA: Screening.

EXAM:
DIGITAL SCREENING BILATERAL MAMMOGRAM WITH 3D TOMO WITH CAD

[L MLO]
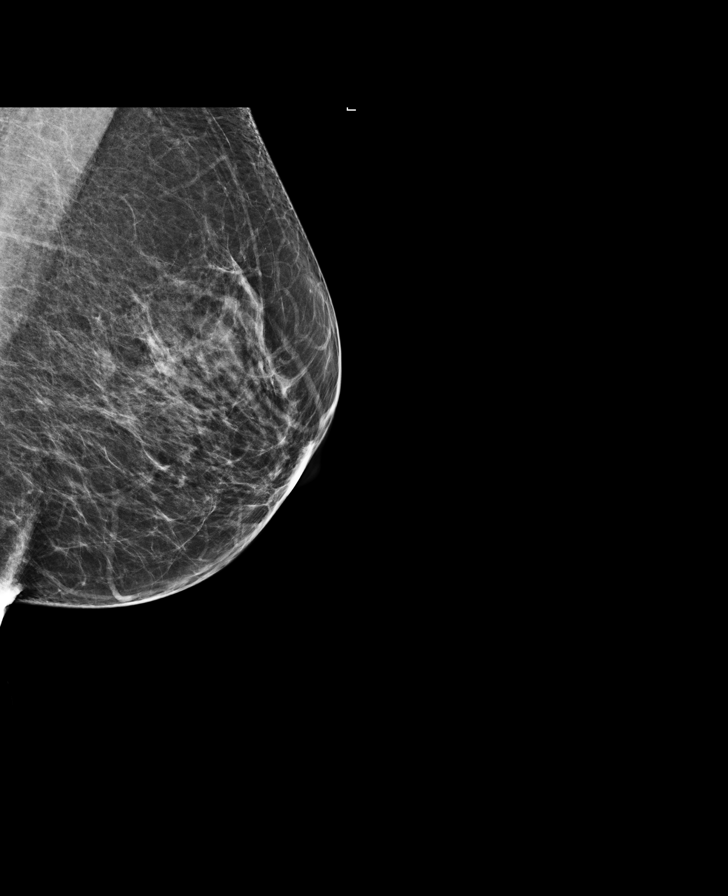

[L CC]
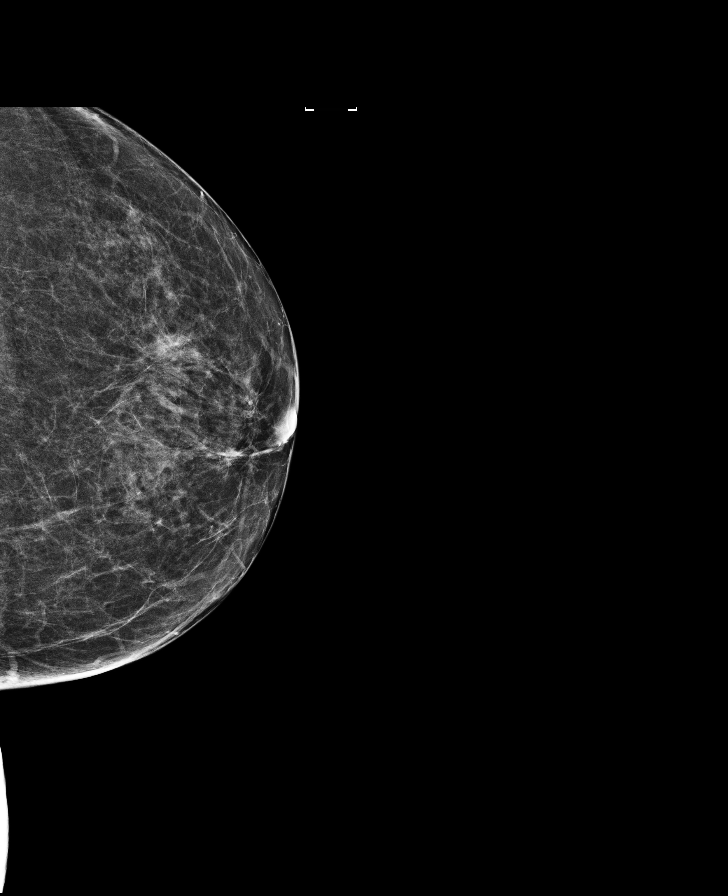

[R CC]
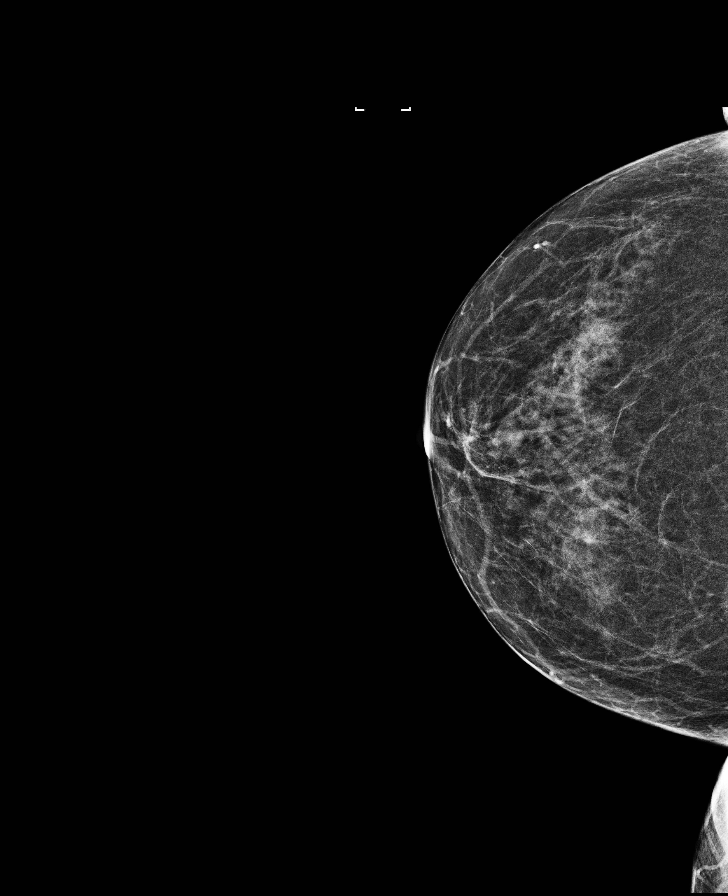

[R MLO]
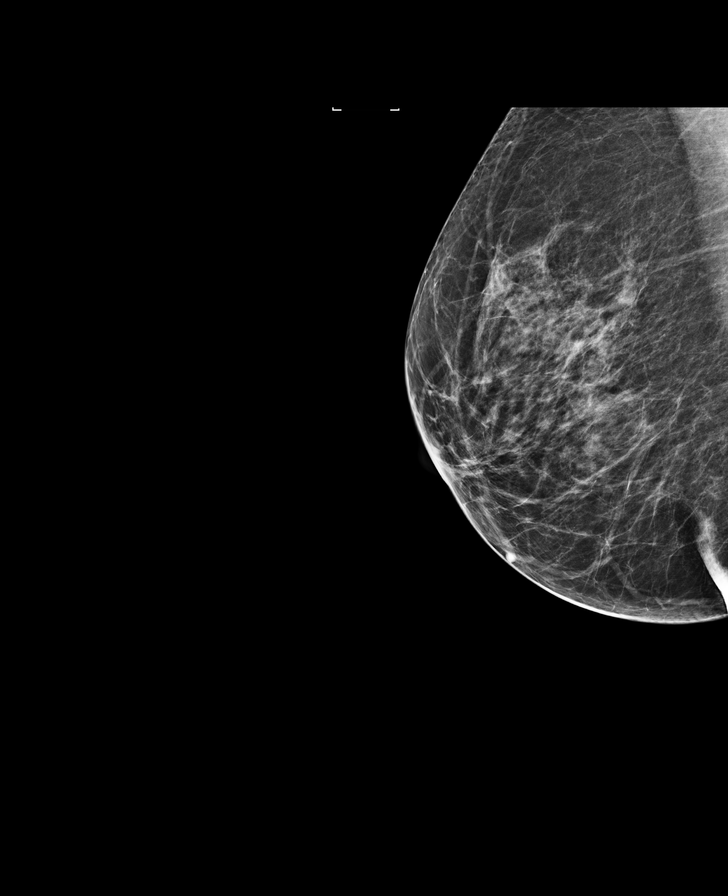

[R CC tomo · tomo slice 29/56.0]
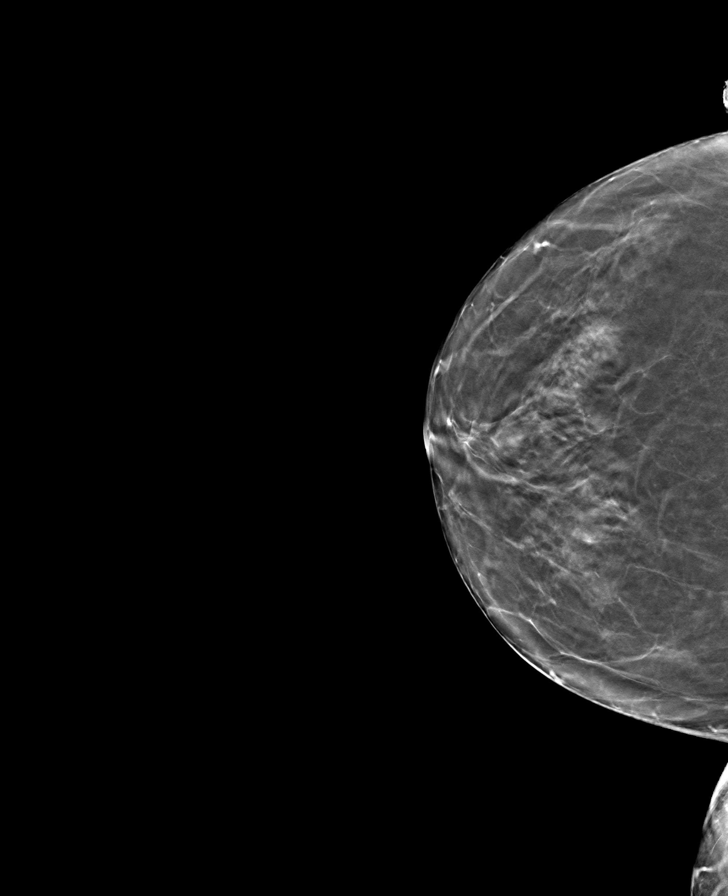

[L CC tomo · tomo slice 29/56.0]
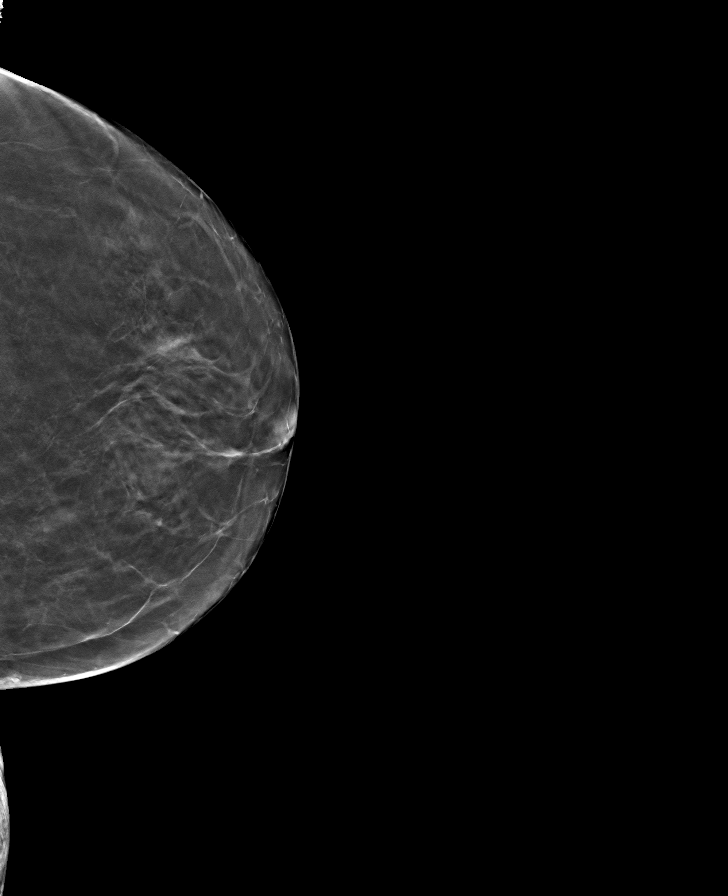

[R MLO tomo · tomo slice 29/57.0]
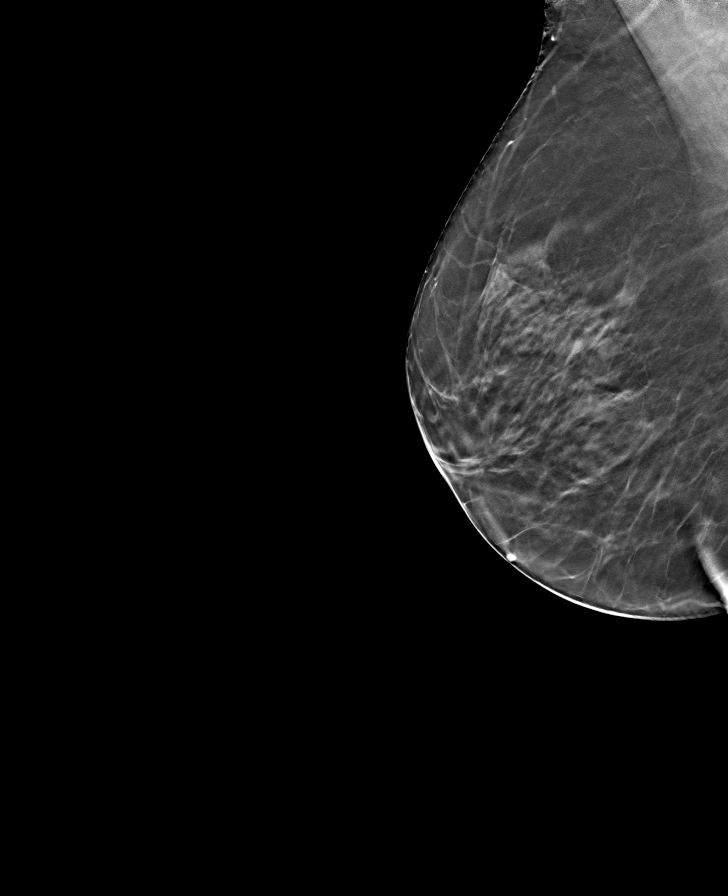

[L MLO tomo · tomo slice 29/58.0]
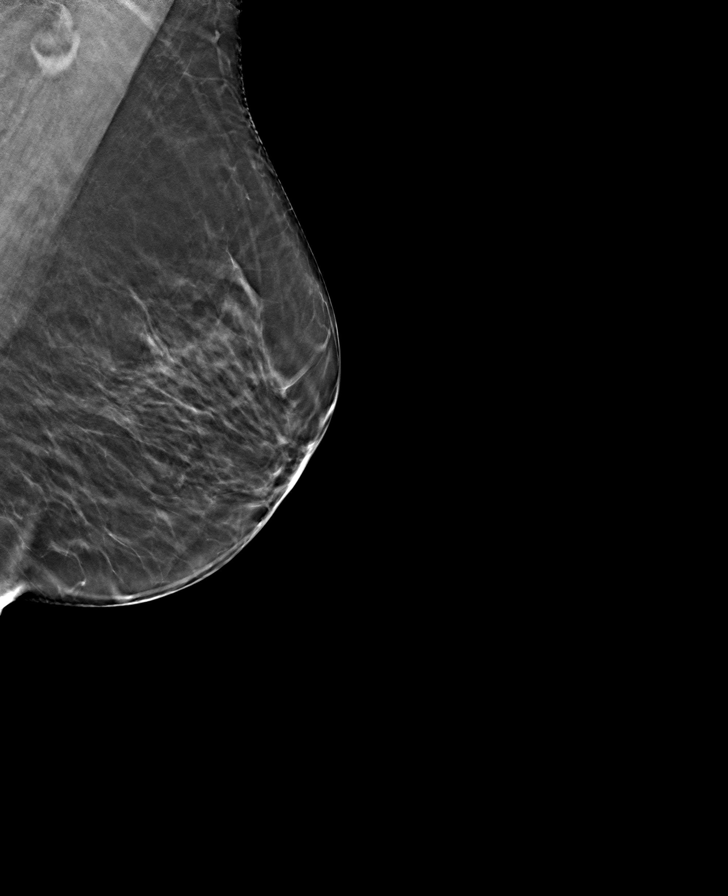

[8 of 24 positions shown; findings below may reference images not displayed]

ACR Breast Density Category b: There are scattered areas of
fibroglandular density.
FINDINGS: There are no findings suspicious for malignancy. Images were
processed with CAD.
IMPRESSION: No mammographic evidence of malignancy. A result letter of this
screening mammogram will be mailed directly to the patient.

RECOMMENDATION:
Screening mammogram in one year. (Code:55-L-23V)

BI-RADS CATEGORY  1: Negative.

## 2016-02-24 DIAGNOSIS — E039 Hypothyroidism, unspecified: Secondary | ICD-10-CM | POA: Diagnosis not present

## 2016-02-28 DIAGNOSIS — M150 Primary generalized (osteo)arthritis: Secondary | ICD-10-CM | POA: Diagnosis not present

## 2016-02-28 DIAGNOSIS — F4322 Adjustment disorder with anxiety: Secondary | ICD-10-CM | POA: Diagnosis not present

## 2016-02-28 DIAGNOSIS — T382X6D Underdosing of antithyroid drugs, subsequent encounter: Secondary | ICD-10-CM | POA: Diagnosis not present

## 2016-02-28 DIAGNOSIS — M130 Polyarthritis, unspecified: Secondary | ICD-10-CM | POA: Diagnosis not present

## 2016-04-03 DIAGNOSIS — T382X6D Underdosing of antithyroid drugs, subsequent encounter: Secondary | ICD-10-CM | POA: Diagnosis not present

## 2016-04-03 DIAGNOSIS — E079 Disorder of thyroid, unspecified: Secondary | ICD-10-CM | POA: Diagnosis not present

## 2016-04-09 DIAGNOSIS — E039 Hypothyroidism, unspecified: Secondary | ICD-10-CM | POA: Diagnosis not present

## 2016-04-09 DIAGNOSIS — I10 Essential (primary) hypertension: Secondary | ICD-10-CM | POA: Diagnosis not present

## 2016-04-09 DIAGNOSIS — F4322 Adjustment disorder with anxiety: Secondary | ICD-10-CM | POA: Diagnosis not present

## 2016-07-03 DIAGNOSIS — E039 Hypothyroidism, unspecified: Secondary | ICD-10-CM | POA: Diagnosis not present

## 2016-07-08 DIAGNOSIS — E039 Hypothyroidism, unspecified: Secondary | ICD-10-CM | POA: Diagnosis not present

## 2016-09-07 DIAGNOSIS — M940 Chondrocostal junction syndrome [Tietze]: Secondary | ICD-10-CM | POA: Diagnosis not present

## 2016-09-07 DIAGNOSIS — E039 Hypothyroidism, unspecified: Secondary | ICD-10-CM | POA: Diagnosis not present

## 2016-09-07 DIAGNOSIS — R040 Epistaxis: Secondary | ICD-10-CM | POA: Diagnosis not present

## 2017-01-05 DIAGNOSIS — E039 Hypothyroidism, unspecified: Secondary | ICD-10-CM | POA: Diagnosis not present

## 2017-01-05 DIAGNOSIS — I10 Essential (primary) hypertension: Secondary | ICD-10-CM | POA: Diagnosis not present

## 2017-01-05 DIAGNOSIS — R634 Abnormal weight loss: Secondary | ICD-10-CM | POA: Diagnosis not present

## 2017-04-01 ENCOUNTER — Ambulatory Visit (HOSPITAL_COMMUNITY)
Admission: RE | Admit: 2017-04-01 | Discharge: 2017-04-01 | Disposition: A | Discharge status: Home / Self Care | Payer: Commercial Managed Care - HMO | Source: Ambulatory Visit | Attending: Family Medicine | Admitting: Family Medicine

## 2017-04-01 ENCOUNTER — Other Ambulatory Visit (HOSPITAL_COMMUNITY): Payer: Self-pay | Admitting: Family Medicine

## 2017-04-01 DIAGNOSIS — R05 Cough: Secondary | ICD-10-CM | POA: Diagnosis not present

## 2017-04-01 DIAGNOSIS — R059 Cough, unspecified: Secondary | ICD-10-CM

## 2017-04-01 DIAGNOSIS — J129 Viral pneumonia, unspecified: Secondary | ICD-10-CM | POA: Insufficient documentation

## 2017-04-01 DIAGNOSIS — E039 Hypothyroidism, unspecified: Secondary | ICD-10-CM | POA: Diagnosis not present

## 2017-04-15 DIAGNOSIS — R05 Cough: Secondary | ICD-10-CM | POA: Diagnosis not present

## 2017-04-15 DIAGNOSIS — Z0000 Encounter for general adult medical examination without abnormal findings: Secondary | ICD-10-CM | POA: Diagnosis not present

## 2017-07-12 DIAGNOSIS — E039 Hypothyroidism, unspecified: Secondary | ICD-10-CM | POA: Diagnosis not present

## 2017-07-12 DIAGNOSIS — I10 Essential (primary) hypertension: Secondary | ICD-10-CM | POA: Diagnosis not present

## 2017-08-13 ENCOUNTER — Ambulatory Visit
Admission: RE | Admit: 2017-08-13 | Discharge: 2017-08-13 | Disposition: A | Discharge status: Home / Self Care | Payer: Commercial Managed Care - HMO | Source: Ambulatory Visit | Attending: Family Medicine | Admitting: Family Medicine

## 2017-08-13 ENCOUNTER — Other Ambulatory Visit: Payer: Self-pay | Admitting: Family Medicine

## 2017-08-13 DIAGNOSIS — S2020XA Contusion of thorax, unspecified, initial encounter: Secondary | ICD-10-CM | POA: Diagnosis not present

## 2017-08-13 DIAGNOSIS — R0781 Pleurodynia: Secondary | ICD-10-CM

## 2017-08-13 DIAGNOSIS — S2249XA Multiple fractures of ribs, unspecified side, initial encounter for closed fracture: Secondary | ICD-10-CM | POA: Diagnosis not present

## 2017-08-13 DIAGNOSIS — S299XXA Unspecified injury of thorax, initial encounter: Secondary | ICD-10-CM | POA: Diagnosis not present

## 2017-08-13 DIAGNOSIS — R079 Chest pain, unspecified: Secondary | ICD-10-CM | POA: Diagnosis not present

## 2017-10-11 DIAGNOSIS — E039 Hypothyroidism, unspecified: Secondary | ICD-10-CM | POA: Diagnosis not present

## 2017-10-11 DIAGNOSIS — I7300 Raynaud's syndrome without gangrene: Secondary | ICD-10-CM | POA: Diagnosis not present

## 2017-10-11 DIAGNOSIS — R634 Abnormal weight loss: Secondary | ICD-10-CM | POA: Diagnosis not present

## 2017-11-10 DIAGNOSIS — I739 Peripheral vascular disease, unspecified: Secondary | ICD-10-CM | POA: Diagnosis not present

## 2017-11-10 DIAGNOSIS — I7300 Raynaud's syndrome without gangrene: Secondary | ICD-10-CM | POA: Diagnosis not present

## 2017-11-10 DIAGNOSIS — I10 Essential (primary) hypertension: Secondary | ICD-10-CM | POA: Diagnosis not present

## 2017-11-10 DIAGNOSIS — E0500 Thyrotoxicosis with diffuse goiter without thyrotoxic crisis or storm: Secondary | ICD-10-CM | POA: Diagnosis not present

## 2018-01-14 DIAGNOSIS — E0500 Thyrotoxicosis with diffuse goiter without thyrotoxic crisis or storm: Secondary | ICD-10-CM | POA: Diagnosis not present

## 2018-01-14 DIAGNOSIS — I10 Essential (primary) hypertension: Secondary | ICD-10-CM | POA: Diagnosis not present

## 2018-01-14 DIAGNOSIS — F4320 Adjustment disorder, unspecified: Secondary | ICD-10-CM | POA: Diagnosis not present

## 2018-04-15 DIAGNOSIS — I10 Essential (primary) hypertension: Secondary | ICD-10-CM | POA: Diagnosis not present

## 2018-04-15 DIAGNOSIS — E039 Hypothyroidism, unspecified: Secondary | ICD-10-CM | POA: Diagnosis not present

## 2018-04-15 DIAGNOSIS — Z0000 Encounter for general adult medical examination without abnormal findings: Secondary | ICD-10-CM | POA: Diagnosis not present

## 2018-04-15 DIAGNOSIS — F4323 Adjustment disorder with mixed anxiety and depressed mood: Secondary | ICD-10-CM | POA: Diagnosis not present

## 2018-06-03 NOTE — Progress Notes (Signed)
Office Visit Note  Patient: Jennifer Mullen             Date of Birth: 22-Sep-1945           MRN: [id number]             PCP: Renaye Rakers, MD Referring: Renaye Rakers, MD Visit Date: 06/15/2018 Occupation: Retired, quality control.  Subjective:  Hands and feet turn white and cold.   History of Present Illness: Jennifer Mullen is a 72 y.o. female seen in consultation per request of her PCP for evaluation of rainouts.  According to patient for the last 2 years she has been noticing that her hands and feet stay cold and turn white and tingle.  There is no history of rash, oral ulcers, nasal ulcers, hair loss, fatigue or hair loss.  There is no family history of autoimmune disease.  She is non-smoker and has never smoked.  She does get some secondary smoking through her son.  Some discomfort in her hands from arthritis which she describes over the [facility] joints.  Activities of Daily Living:  Patient reports morning stiffness for 0 minute.   Patient Denies nocturnal pain.  Difficulty dressing/grooming: Denies Difficulty climbing stairs: Denies Difficulty getting out of chair: Denies Difficulty using hands for taps, buttons, cutlery, and/or writing: Denies  Review of Systems  Constitutional: Negative for fatigue, night sweats, weight gain and weight loss.  HENT: Positive for mouth dryness. Negative for mouth sores, trouble swallowing, trouble swallowing and nose dryness.   Eyes: Negative for pain, redness, visual disturbance and dryness.  Respiratory: Negative for cough, shortness of breath and difficulty breathing.   Cardiovascular: Negative for chest pain, palpitations, hypertension, irregular heartbeat and swelling in legs/feet.  Gastrointestinal: Negative for blood in stool, constipation and diarrhea.  Endocrine: Negative for increased urination.  Genitourinary: Negative for vaginal dryness.  Musculoskeletal: Positive for arthralgias and joint pain. Negative for joint swelling, myalgias,  muscle weakness, morning stiffness, muscle tenderness and myalgias.  Skin: Positive for color change. Negative for rash, hair loss, skin tightness, ulcers and sensitivity to sunlight.  Allergic/Immunologic: Negative for susceptible to infections.  Neurological: Negative for dizziness, memory loss, night sweats and weakness.  Hematological: Negative for swollen glands.  Psychiatric/Behavioral: Negative for depressed mood and sleep disturbance. The patient is not nervous/anxious.     PMFS History:  Patient Active Problem List   Diagnosis Date Noted  . HYPOTHYROIDISM 05/21/2010  . ANOREXIA 12/21/2009  . THYROMEGALY 12/03/2009  . WEIGHT LOSS 12/03/2009    Past Medical History:  Diagnosis Date  . Hypercholesteremia   . Hypertension   . Syncope   . Thyroid disease     Family History  Problem Relation Age of Onset  . Leukemia Mother   . Heart Problems Father   . Diabetes Son    Past Surgical History:  Procedure Laterality Date  . ABDOMINAL HYSTERECTOMY    . APPENDECTOMY    . CHOLECYSTECTOMY    . TUBAL LIGATION     Social History   Social History Narrative  . Not on file    Objective: Vital Signs: BP 134/75 (BP Location: Right Arm, Patient Position: Sitting, Cuff Size: Normal)   Pulse 61   Resp 13   Ht 5\' 2"  (1.575 m)   Wt 103 lb (46.7 kg)   BMI 18.84 kg/m    Physical Exam  Constitutional: She is oriented to person, place, and time. She appears well-developed and well-nourished.  HENT:  Head: Normocephalic and atraumatic.  Missing dentition  Eyes: Conjunctivae and EOM are normal.  Neck: Normal range of motion.  Cardiovascular: Normal rate, regular rhythm, normal heart sounds and intact distal pulses.  Pulmonary/Chest: Effort normal and breath sounds normal.  Abdominal: Soft. Bowel sounds are normal.  Lymphadenopathy:    She has no cervical adenopathy.  Neurological: She is alert and oriented to person, place, and time.  Skin: Skin is warm and dry. Capillary  refill takes 2 to 3 seconds. There is erythema.  Facial erythema  Psychiatric: She has a normal mood and affect. Her behavior is normal.  Nursing note and vitals reviewed.    Musculoskeletal Exam: C-spine thoracic lumbar spine good range of motion.  Shoulder joints elbow joints wrist joint MCPs PIPs DIPs been good range of motion.  She has some prominence of CMC  joints consistent with osteoarthritis.  Hip joints knee joints ankles MTPs PIPs were in good range of motion with no synovitis. CDAI Exam: CDAI Score: Not documented Patient Global Assessment: Not documented; Provider Global Assessment: Not documented Swollen: Not documented; Tender: Not documented Joint Exam   Not documented   There is currently no information documented on the homunculus. Go to the Rheumatology activity and complete the homunculus joint exam.  Investigation: No additional findings.  Imaging: No results found.  Recent Labs: Lab Results  Component Value Date   WBC 6.6 05/12/2014   HGB 13.0 05/12/2014   PLT 238 05/12/2014   NA 141 05/12/2014   K 4.3 05/12/2014   CL 103 05/12/2014   CO2 24 05/12/2014   GLUCOSE 104 (H) 05/12/2014   BUN 23 05/12/2014   CREATININE 1.22 (H) 05/12/2014   BILITOT 0.4 05/12/2014   ALKPHOS 114 05/12/2014   AST 20 05/12/2014   ALT 14 05/12/2014   PROT 7.3 05/12/2014   ALBUMIN 4.3 05/12/2014   CALCIUM 9.1 05/12/2014   GFRAA 52 (L) 05/12/2014    Speciality Comments: No specialty comments available.  Procedures:  No procedures performed Allergies: Patient has no known allergies.   Assessment / Plan:     Visit Diagnoses: Raynaud's syndrome without gangrene -patient gives history of Raynolds for the last 2 years.  She has pallor in her hands and feet without any digital ulcers.  She has decreased capillary refill.  No nailbed capillary changes were noted.  No sclerodactyly was noted.  She has mild facial erythema.  She denies any history of autoimmune disease in her  family.  She is non-smoker.  She states she gets some secondary smoking to worsen.  I will obtain following labs to evaluate this further.- Plan: Rheumatoid factor, ANA, Anti-scleroderma antibody, RNP Antibody, Anti-Smith antibody, Anti-DNA antibody, double-stranded, C3 and C4, Beta-2 glycoprotein antibodies, Cardiolipin antibodies, IgG, IgM, IgA, Lupus Anticoagulant Eval w/Reflex, Angiotensin converting enzyme, Pan-ANCA, Cryoglobulin, Hepatitis B core antibody, IgM, Hepatitis B surface antigen, Hepatitis C antibody, Serum protein electrophoresis with reflex.  Use of warm clothing and keeping core temperature warm was discussed.  Dry mouth -patient gives history of dry mouth.  She has lost dentition and is wearing dentures.  She still needs further work-up on her dentition.  Over-the-counter products were discussed.  Plan: Sjogrens syndrome-A extractable nuclear antibody, Sjogrens syndrome-B extractable nuclear antibody  Other fatigue -she gives history of some fatigue only after exertion.  Plan: CBC with Differential/Platelet, COMPLETE METABOLIC PANEL WITH GFR, Urinalysis, Routine w reflex microscopic, CK, Glucose 6 phosphate dehydrogenase  Essential hypertension-she is currently on no medications and her blood pressure is mildly elevated.  She may benefit  from low-dose Norvasc.  Weight loss-patient reports weight loss which she relates to poor dentition and difficulty eating.  History of hypothyroidism  Thyromegaly  Hypercholesteremia   Orders: Orders Placed This Encounter  Procedures  . CBC with Differential/Platelet  . COMPLETE METABOLIC PANEL WITH GFR  . Urinalysis, Routine w reflex microscopic  . CK  . Rheumatoid factor  . ANA  . Anti-scleroderma antibody  . RNP Antibody  . Anti-Smith antibody  . Sjogrens syndrome-A extractable nuclear antibody  . Sjogrens syndrome-B extractable nuclear antibody  . Anti-DNA antibody, double-stranded  . C3 and C4  . Beta-2 glycoprotein antibodies   . Cardiolipin antibodies, IgG, IgM, IgA  . Lupus Anticoagulant Eval w/Reflex  . Angiotensin converting enzyme  . Pan-ANCA  . Cryoglobulin  . Hepatitis B core antibody, IgM  . Hepatitis B surface antigen  . Hepatitis C antibody  . Serum protein electrophoresis with reflex  . Glucose 6 phosphate dehydrogenase   No orders of the defined types were placed in this encounter.   Face-to-face time spent with patient was 40 minutes. Greater than 50% of time was spent in counseling and coordination of care.  Follow-Up Instructions: Return for raynauds, sicca.   Pollyann Savoy, MD  Note - This record has been created using Animal nutritionist.  Chart creation errors have been sought, but may not always  have been located. Such creation errors do not reflect on  the standard of medical care.

## 2018-06-15 ENCOUNTER — Ambulatory Visit: Payer: Medicare PPO | Admitting: Rheumatology

## 2018-06-15 ENCOUNTER — Encounter: Payer: Self-pay | Admitting: Rheumatology

## 2018-06-15 VITALS — BP 134/75 | HR 61 | Resp 13 | Ht 62.0 in | Wt 103.0 lb

## 2018-06-15 DIAGNOSIS — I7300 Raynaud's syndrome without gangrene: Secondary | ICD-10-CM | POA: Diagnosis not present

## 2018-06-15 DIAGNOSIS — R682 Dry mouth, unspecified: Secondary | ICD-10-CM | POA: Diagnosis not present

## 2018-06-15 DIAGNOSIS — E7800 Pure hypercholesterolemia, unspecified: Secondary | ICD-10-CM

## 2018-06-15 DIAGNOSIS — E010 Iodine-deficiency related diffuse (endemic) goiter: Secondary | ICD-10-CM | POA: Diagnosis not present

## 2018-06-15 DIAGNOSIS — Z8639 Personal history of other endocrine, nutritional and metabolic disease: Secondary | ICD-10-CM | POA: Diagnosis not present

## 2018-06-15 DIAGNOSIS — I10 Essential (primary) hypertension: Secondary | ICD-10-CM | POA: Diagnosis not present

## 2018-06-15 DIAGNOSIS — R5383 Other fatigue: Secondary | ICD-10-CM | POA: Diagnosis not present

## 2018-06-23 LAB — COMPLETE METABOLIC PANEL WITH GFR
AG RATIO: 2.2 (calc) (ref 1.0–2.5)
ALBUMIN MSPROF: 5.3 g/dL — AB (ref 3.6–5.1)
ALKALINE PHOSPHATASE (APISO): 103 U/L (ref 33–130)
ALT: 9 U/L (ref 6–29)
AST: 15 U/L (ref 10–35)
BILIRUBIN TOTAL: 0.7 mg/dL (ref 0.2–1.2)
BUN: 11 mg/dL (ref 7–25)
CO2: 28 mmol/L (ref 20–32)
Calcium: 9.9 mg/dL (ref 8.6–10.4)
Chloride: 103 mmol/L (ref 98–110)
Creat: 0.87 mg/dL (ref 0.60–0.93)
GFR, EST NON AFRICAN AMERICAN: 67 mL/min/{1.73_m2} (ref >=60)
GFR, Est African American: 78 mL/min/{1.73_m2} (ref >=60)
GLOBULIN: 2.4 g/dL (ref 1.9–3.7)
Glucose, Bld: 66 mg/dL (ref 65–99)
POTASSIUM: 4.1 mmol/L (ref 3.5–5.3)
Sodium: 141 mmol/L (ref 135–146)
Total Protein: 7.7 g/dL (ref 6.1–8.1)

## 2018-06-23 LAB — CBC WITH DIFFERENTIAL/PLATELET
BASOS ABS: 40 {cells}/uL (ref 0–200)
Basophils Relative: 0.7 %
EOS ABS: 188 {cells}/uL (ref 15–500)
EOS PCT: 3.3 %
HEMATOCRIT: 39.9 % (ref 35.0–45.0)
HEMOGLOBIN: 13.3 g/dL (ref 11.7–15.5)
LYMPHS ABS: 1471 {cells}/uL (ref 850–3900)
MCH: 28.4 pg (ref 27.0–33.0)
MCHC: 33.3 g/dL (ref 32.0–36.0)
MCV: 85.1 fL (ref 80.0–100.0)
MPV: 12.4 fL (ref 7.5–12.5)
Monocytes Relative: 8.4 %
NEUTROS ABS: 3523 {cells}/uL (ref 1500–7800)
NEUTROS PCT: 61.8 %
Platelets: 252 10*3/uL (ref 140–400)
RBC: 4.69 10*6/uL (ref 3.80–5.10)
RDW: 13.2 % (ref 11.0–15.0)
Total Lymphocyte: 25.8 %
WBC: 5.7 10*3/uL (ref 3.8–10.8)
WBCMIX: 479 {cells}/uL (ref 200–950)

## 2018-06-23 LAB — ANTI-NUCLEAR AB-TITER (ANA TITER): ANA Titer 1: >=1:1280 {titer} — AB

## 2018-06-23 LAB — PROTEIN ELECTROPHORESIS, SERUM, WITH REFLEX
ALBUMIN ELP: 5.1 g/dL — AB (ref 3.8–4.8)
ALPHA 2: 0.8 g/dL (ref 0.5–0.9)
Alpha 1: 0.3 g/dL (ref 0.2–0.3)
BETA 2: 0.3 g/dL (ref 0.2–0.5)
Beta Globulin: 0.5 g/dL (ref 0.4–0.6)
Gamma Globulin: 0.8 g/dL (ref 0.8–1.7)
TOTAL PROTEIN: 7.8 g/dL (ref 6.1–8.1)

## 2018-06-23 LAB — URINALYSIS, ROUTINE W REFLEX MICROSCOPIC
Bilirubin Urine: NEGATIVE
Glucose, UA: NEGATIVE
HGB URINE DIPSTICK: NEGATIVE
Ketones, ur: NEGATIVE
LEUKOCYTES UA: NEGATIVE
NITRITE: NEGATIVE
PH: 5.5 (ref 5.0–8.0)
PROTEIN: NEGATIVE
Specific Gravity, Urine: 1.011 (ref 1.001–1.03)

## 2018-06-23 LAB — CARDIOLIPIN ANTIBODIES, IGG, IGM, IGA

## 2018-06-23 LAB — HEPATITIS C ANTIBODY
Hepatitis C Ab: NONREACTIVE
SIGNAL TO CUT-OFF: 0.02 (ref <1.00)

## 2018-06-23 LAB — PAN-ANCA
ANCA Screen: NEGATIVE
Myeloperoxidase Abs: <1 AI
Serine Protease 3: <1 AI

## 2018-06-23 LAB — BETA-2 GLYCOPROTEIN ANTIBODIES: Beta-2 Glyco I IgG: <9 SGU (ref <=20)

## 2018-06-23 LAB — LUPUS ANTICOAGULANT EVAL W/ REFLEX
DRVVT: 33 s (ref <=45)
PTT-LA Screen: 35 s (ref <=40)

## 2018-06-23 LAB — SJOGRENS SYNDROME-A EXTRACTABLE NUCLEAR ANTIBODY: SSA (Ro) (ENA) Antibody, IgG: <1 AI

## 2018-06-23 LAB — C3 AND C4
C3 Complement: 125 mg/dL (ref 83–193)
C4 COMPLEMENT: 33 mg/dL (ref 15–57)

## 2018-06-23 LAB — RNP ANTIBODY: Ribonucleic Protein(ENA) Antibody, IgG: 1.5 AI — AB

## 2018-06-23 LAB — ANTI-SMITH ANTIBODY: ENA SM Ab Ser-aCnc: <1 AI

## 2018-06-23 LAB — RHEUMATOID FACTOR

## 2018-06-23 LAB — HEPATITIS B SURFACE ANTIGEN: Hepatitis B Surface Ag: NONREACTIVE

## 2018-06-23 LAB — IFE INTERPRETATION: IMMUNOFIX ELECTR INT: NOT DETECTED

## 2018-06-23 LAB — SJOGRENS SYNDROME-B EXTRACTABLE NUCLEAR ANTIBODY: SSB (La) (ENA) Antibody, IgG: <1 AI

## 2018-06-23 LAB — GLUCOSE 6 PHOSPHATE DEHYDROGENASE: G-6PDH: 19.6 U/g Hgb (ref 7.0–20.5)

## 2018-06-23 LAB — ANTI-SCLERODERMA ANTIBODY: SCLERODERMA (SCL-70) (ENA) ANTIBODY, IGG: NEGATIVE AI

## 2018-06-23 LAB — CK: Total CK: 116 U/L (ref 29–143)

## 2018-06-23 LAB — ANA: Anti Nuclear Antibody(ANA): POSITIVE — AB

## 2018-06-23 LAB — ANGIOTENSIN CONVERTING ENZYME: ANGIOTENSIN-CONVERTING ENZYME: 33 U/L (ref 9–67)

## 2018-06-23 LAB — ANTI-DNA ANTIBODY, DOUBLE-STRANDED: ds DNA Ab: <1 IU/mL

## 2018-06-23 LAB — CRYOGLOBULIN: Cryoglobulin, Qualitative Analysis: NOT DETECTED

## 2018-06-23 LAB — HEPATITIS B CORE ANTIBODY, IGM: Hep B C IgM: NONREACTIVE

## 2018-06-23 NOTE — Progress Notes (Signed)
Patient is positive ANA and positive RNP.  I will discuss lab results at the follow-up visit.

## 2018-06-30 DIAGNOSIS — M351 Other overlap syndromes: Secondary | ICD-10-CM | POA: Insufficient documentation

## 2018-06-30 NOTE — Progress Notes (Signed)
Office Visit Note  Patient: Jennifer Mullen             Date of Birth: 03/29/1946           MRN: [id number]             PCP: Renaye Rakers, MD Referring: Renaye Rakers, MD Visit Date: 07/06/2018 Occupation: @GUAROCC @  Subjective:  Raynauds.   History of Present Illness: Jennifer Mullen is a 72 y.o. female with mixed connective tissue disease.  She states she continues to have Raynauds phenomenon.  She has been also experiencing some pain in her hands but she describes over the [facility] joint.  He continues to have dry mouth and dry eye syndrome.  Fatigue is tolerable currently.  Activities of Daily Living:  Patient reports morning stiffness for 15 minutes.   Patient Reports nocturnal pain.  Difficulty dressing/grooming: Denies Difficulty climbing stairs: Denies Difficulty getting out of chair: Denies Difficulty using hands for taps, buttons, cutlery, and/or writing: Reports  Review of Systems  Constitutional: Negative for fatigue.  HENT: Positive for mouth dryness. Negative for mouth sores, trouble swallowing and trouble swallowing.   Eyes: Negative for pain, redness, itching and dryness.  Respiratory: Negative for shortness of breath, wheezing and difficulty breathing.   Cardiovascular: Negative for chest pain, palpitations and swelling in legs/feet.  Gastrointestinal: Negative for abdominal pain, constipation, diarrhea, nausea and vomiting.  Endocrine: Negative for increased urination.  Genitourinary: Negative for painful urination, nocturia and pelvic pain.  Musculoskeletal: Positive for arthralgias, joint pain and morning stiffness. Negative for joint swelling.  Skin: Negative for rash and hair loss.  Allergic/Immunologic: Negative for susceptible to infections.  Neurological: Positive for numbness. Negative for dizziness, light-headedness, headaches, memory loss and weakness.  Hematological: Negative for bruising/bleeding tendency.  Psychiatric/Behavioral: Negative for  confusion. The patient is not nervous/anxious.     PMFS History:  Patient Active Problem List   Diagnosis Date Noted  . Mixed connective tissue disease (HCC) 06/30/2018  . HYPOTHYROIDISM 05/21/2010  . ANOREXIA 12/21/2009  . THYROMEGALY 12/03/2009  . WEIGHT LOSS 12/03/2009    Past Medical History:  Diagnosis Date  . Hypercholesteremia   . Hypertension   . Syncope   . Thyroid disease     Family History  Problem Relation Age of Onset  . Leukemia Mother   . Heart Problems Father   . Diabetes Son    Past Surgical History:  Procedure Laterality Date  . ABDOMINAL HYSTERECTOMY    . APPENDECTOMY    . CHOLECYSTECTOMY    . TUBAL LIGATION     Social History   Social History Narrative  . Not on file    Objective: Vital Signs: BP 123/76 (BP Location: Left Arm, Patient Position: Sitting, Cuff Size: Normal)   Pulse 82   Resp 14   Ht 5\' 2"  (1.575 m)   Wt 106 lb (48.1 kg)   BMI 19.39 kg/m    Physical Exam  Constitutional: She is oriented to person, place, and time. She appears well-developed and well-nourished.  HENT:  Head: Normocephalic and atraumatic.  Eyes: Conjunctivae and EOM are normal.  Neck: Normal range of motion.  Cardiovascular: Normal rate, regular rhythm, normal heart sounds and intact distal pulses.  Pulmonary/Chest: Effort normal and breath sounds normal.  Abdominal: Soft. Bowel sounds are normal.  Lymphadenopathy:    She has no cervical adenopathy.  Neurological: She is alert and oriented to person, place, and time.  Skin: Skin is warm and dry. Capillary refill takes less  than 2 seconds. Rash noted.  Mild malar rash was noted.  Psychiatric: She has a normal mood and affect. Her behavior is normal.  Nursing note and vitals reviewed.    Musculoskeletal Exam: C-spine thoracic lumbar spine good range of motion.  Shoulder joints elbow joints wrist joint MCPs PIPs DIPs were in good range of motion with no synovitis.  Hip joints knee joints ankles MTPs PIPs  DIPs were in good range of motion with no synovitis.  CDAI Exam: CDAI Score: Not documented Patient Global Assessment: Not documented; Provider Global Assessment: Not documented Swollen: Not documented; Tender: Not documented Joint Exam   Not documented   There is currently no information documented on the homunculus. Go to the Rheumatology activity and complete the homunculus joint exam.  Investigation: No additional findings.  Imaging: No results found.  Recent Labs: Lab Results  Component Value Date   WBC 5.7 06/15/2018   HGB 13.3 06/15/2018   PLT 252 06/15/2018   NA 141 06/15/2018   K 4.1 06/15/2018   CL 103 06/15/2018   CO2 28 06/15/2018   GLUCOSE 66 06/15/2018   BUN 11 06/15/2018   CREATININE 0.87 06/15/2018   BILITOT 0.7 06/15/2018   ALKPHOS 114 05/12/2014   AST 15 06/15/2018   ALT 9 06/15/2018   PROT 7.7 06/15/2018   PROT 7.8 06/15/2018   ALBUMIN 4.3 05/12/2014   CALCIUM 9.9 06/15/2018   GFRAA 78 06/15/2018  UA negative, IFE negative, hepatitis B-, hepatitis C negative, G6PD normal, CK 116 ANA > 1: 1280 centromere and speckled RNP 1.5+, (double-stranded DNA, SSA, SSB, Smith, SCL 70-) anticardiolipin negative beta-2 negative, lupus anticoagulant negative, C3-C4 normal, ANCA negative, ACE 33, cryo negative, RF negative  Speciality Comments: No specialty comments available.  Procedures:  No procedures performed Allergies: Patient has no known allergies.   Assessment / Plan:     Visit Diagnoses: Mixed connective tissue disease (HCC) - History of fatigue, sicca symptoms, malar rash, Raynaud's phenomenon, positive ANA, positive RNP.  Detailed counseling regarding mixed connective tissue disease was provided.  Different treatment options and their side effects were discussed.  Indications side effects contraindications of Plaquenil were discussed.  Handout was given and consent was taken.  The plan is to start her on Plaquenil 200 mg p.o. daily.  We will check labs  in 1 month and then every 3 months to monitor for drug toxicity.  She will need baseline eye exam and then yearly eye examination.  She will also need immunization including flu vaccine, pneumococcal vaccine and Shingrix vaccine.  I will also refer her to cardiology for evaluation has mixed connective tissue disease can be associated with pulmonary hypertension.  Sicca symptoms-I offered pilocarpine but patient declined.  She is still going through dental work.  High risk medication use-the plan is to start her on Plaquenil 200 mg p.o. daily.  Raynaud's disease without gangrene-Raynauds is intermittent.  Amlodipine can be considered if her renal symptoms get worse.  Poor dentition - Wear dentures  Weight loss - Due to difficulty eating  History of hypothyroidism  Dyslipidemia -she is on Crestor.  Orders: Orders Placed This Encounter  Procedures  . CBC with Differential/Platelet  . COMPLETE METABOLIC PANEL WITH GFR  . Ambulatory referral to Cardiology   Meds ordered this encounter  Medications  . hydroxychloroquine (PLAQUENIL) 200 MG tablet    Sig: Take 1 tablet (200 mg total) by mouth daily.    Dispense:  90 tablet    Refill:  0  Face-to-face time spent with patient was 40 minutes. Greater than 50% of time was spent in counseling and coordination of care.  Follow-Up Instructions: Return in about 3 months (around 10/06/2018) for Mixed Connective tissue disease.   Pollyann Savoy, MD  Note - This record has been created using Animal nutritionist.  Chart creation errors have been sought, but may not always  have been located. Such creation errors do not reflect on  the standard of medical care.

## 2018-07-06 ENCOUNTER — Encounter: Payer: Self-pay | Admitting: Rheumatology

## 2018-07-06 ENCOUNTER — Ambulatory Visit: Payer: Medicare PPO | Admitting: Rheumatology

## 2018-07-06 VITALS — BP 123/76 | HR 82 | Resp 14 | Ht 62.0 in | Wt 106.0 lb

## 2018-07-06 DIAGNOSIS — K089 Disorder of teeth and supporting structures, unspecified: Secondary | ICD-10-CM | POA: Diagnosis not present

## 2018-07-06 DIAGNOSIS — Z79899 Other long term (current) drug therapy: Secondary | ICD-10-CM | POA: Diagnosis not present

## 2018-07-06 DIAGNOSIS — I7300 Raynaud's syndrome without gangrene: Secondary | ICD-10-CM | POA: Diagnosis not present

## 2018-07-06 DIAGNOSIS — Z8639 Personal history of other endocrine, nutritional and metabolic disease: Secondary | ICD-10-CM

## 2018-07-06 DIAGNOSIS — M351 Other overlap syndromes: Secondary | ICD-10-CM

## 2018-07-06 DIAGNOSIS — E785 Hyperlipidemia, unspecified: Secondary | ICD-10-CM

## 2018-07-06 DIAGNOSIS — R634 Abnormal weight loss: Secondary | ICD-10-CM

## 2018-07-06 MED ORDER — HYDROXYCHLOROQUINE SULFATE 200 MG PO TABS: 200.0000 mg | ORAL_TABLET | Freq: Every day | ORAL | 0 refills | Status: DC

## 2018-07-06 NOTE — Patient Instructions (Addendum)
Standing Labs We placed an order today for your standing lab work.    Please come back and get your standing labs in 1 month and then every 5 months.  We have open lab Monday through Friday from 8:30-11:30 AM and 1:30-4:00 PM  at the office of Dr. Pollyann SavoyShaili Chayse Zatarain.   You may experience shorter wait times on Monday and Friday afternoons. The office is located at [address], [address], [place], [place] [id number] No appointment is necessary.   Labs are drawn by [facility].  You may receive a bill from [facility] for your lab work. If you have any questions regarding directions or hours of operation,  please call [phone].   Just as a reminder please drink plenty of water prior to coming for your lab work. Thanks!  Vaccines You are taking a medication(s) that can suppress your immune system.  The following immunizations are recommended: . Flu annually . Pneumonia (Pneumovax 23 and Prevnar 13 after age 71) . Shingrix  Please check with your PCP to make sure you are up to date.   Hydroxychloroquine tablets What is this medicine? HYDROXYCHLOROQUINE (hye drox ee KLOR oh kwin) is used to treat rheumatoid arthritis and systemic lupus erythematosus. It is also used to treat malaria. This medicine may be used for other purposes; ask your health care provider or pharmacist if you have questions. COMMON BRAND NAME(S): Plaquenil, Quineprox What should I tell my health care provider before I take this medicine? They need to know if you have any of these conditions: -diabetes -eye disease, vision problems -G6PD deficiency -history of blood diseases -history of irregular heartbeat -if you often drink alcohol -kidney disease -liver disease -porphyria -psoriasis -seizures -an unusual or allergic reaction to chloroquine, hydroxychloroquine, other medicines, foods, dyes, or preservatives -pregnant or trying to get pregnant -breast-feeding How should I use this medicine? Take this medicine by  mouth with a glass of water. Follow the directions on the prescription label. Avoid taking antacids within 4 hours of taking this medicine. It is best to separate these medicines by at least 4 hours. Do not cut, crush or chew this medicine. You can take it with or without food. If it upsets your stomach, take it with food. Take your medicine at regular intervals. Do not take your medicine more often than directed. Take all of your medicine as directed even if you think you are better. Do not skip doses or stop your medicine early. Talk to your pediatrician regarding the use of this medicine in children. While this drug may be prescribed for selected conditions, precautions do apply. Overdosage: If you think you have taken too much of this medicine contact a poison control center or emergency room at once. NOTE: This medicine is only for you. Do not share this medicine with others. What if I miss a dose? If you miss a dose, take it as soon as you can. If it is almost time for your next dose, take only that dose. Do not take double or extra doses. What may interact with this medicine? Do not take this medicine with any of the following medications: -cisapride -dofetilide -dronedarone -live virus vaccines -penicillamine -pimozide -thioridazine -ziprasidone This medicine may also interact with the following medications: -ampicillin -antacids -cimetidine -cyclosporine -digoxin -medicines for diabetes, like insulin, glipizide, glyburide -medicines for seizures like carbamazepine, phenobarbital, phenytoin -mefloquine -methotrexate -other medicines that prolong the QT interval (cause an abnormal heart rhythm) -praziquantel This list may not describe all possible interactions. Give your health  care provider a list of all the medicines, herbs, non-prescription drugs, or dietary supplements you use. Also tell them if you smoke, drink alcohol, or use illegal drugs. Some items may interact with your  medicine. What should I watch for while using this medicine? Tell your doctor or healthcare professional if your symptoms do not start to get better or if they get worse. Avoid taking antacids within 4 hours of taking this medicine. It is best to separate these medicines by at least 4 hours. Tell your doctor or health care professional right away if you have any change in your eyesight. Your vision and blood may be tested before and during use of this medicine. This medicine can make you more sensitive to the sun. Keep out of the sun. If you cannot avoid being in the sun, wear protective clothing and use sunscreen. Do not use sun lamps or tanning beds/booths. What side effects may I notice from receiving this medicine? Side effects that you should report to your doctor or health care professional as soon as possible: -allergic reactions like skin rash, itching or hives, swelling of the face, lips, or tongue -changes in vision -decreased hearing or ringing of the ears -redness, blistering, peeling or loosening of the skin, including inside the mouth -seizures -sensitivity to light -signs and symptoms of a dangerous change in heartbeat or heart rhythm like chest pain; dizziness; fast or irregular heartbeat; palpitations; feeling faint or lightheaded, falls; breathing problems -signs and symptoms of liver injury like dark yellow or brown urine; general ill feeling or flu-like symptoms; light-colored stools; loss of appetite; nausea; right upper belly pain; unusually weak or tired; yellowing of the eyes or skin -signs and symptoms of low blood sugar such as feeling anxious; confusion; dizziness; increased hunger; unusually weak or tired; sweating; shakiness; cold; irritable; headache; blurred vision; fast heartbeat; loss of consciousness -uncontrollable head, mouth, neck, arm, or leg movements Side effects that usually do not require medical attention (report to your doctor or health care professional  if they continue or are bothersome): -anxious -diarrhea -dizziness -hair loss -headache -irritable -loss of appetite -nausea, vomiting -stomach pain This list may not describe all possible side effects. Call your doctor for medical advice about side effects. You may report side effects to FDA at 1-800-FDA-1088. Where should I keep my medicine? Keep out of the reach of children. In children, this medicine can cause overdose with small doses. Store at room temperature between 15 and 30 degrees C (59 and 86 degrees F). Protect from moisture and light. Throw away any unused medicine after the expiration date. NOTE: This sheet is a summary. It may not cover all possible information. If you have questions about this medicine, talk to your doctor, pharmacist, or health care provider.  2018 Elsevier/Gold Standard (2016-03-25 14:16:15)

## 2018-07-06 NOTE — Progress Notes (Signed)
Pharmacy Note  Patient was counseled on the purpose, proper use, and adverse effects of hydroxychloroquine including nausea/diarrhea, skin rash, headaches, and sun sensitivity.  Discussed importance of annual eye exams while on hydroxychloroquine to monitor to ocular toxicity and discussed importance of frequent laboratory monitoring.  Provided patient with eye exam form for baseline ophthalmologic exam.  Provided patient with educational materials on hydroxychloroquine and answered all questions.  Patient consented to hydroxychloroquine.  Will upload consent in the media tab.    Instructed patient to return in 1 month and then every 5 months to monitor for drug toxicity.  Discussed recommended vaccines and included in AVS to follow up with PCP.   All questions encouraged and answered.  Verlin FesterAmber Devera Englander, PharmD, [facility] Rheumatology Clinical Pharmacist  07/06/2018 9:48 AM

## 2018-07-15 DIAGNOSIS — I7300 Raynaud's syndrome without gangrene: Secondary | ICD-10-CM | POA: Diagnosis not present

## 2018-07-15 DIAGNOSIS — I739 Peripheral vascular disease, unspecified: Secondary | ICD-10-CM | POA: Diagnosis not present

## 2018-07-15 DIAGNOSIS — H2513 Age-related nuclear cataract, bilateral: Secondary | ICD-10-CM | POA: Diagnosis not present

## 2018-07-15 DIAGNOSIS — H35363 Drusen (degenerative) of macula, bilateral: Secondary | ICD-10-CM | POA: Diagnosis not present

## 2018-07-15 DIAGNOSIS — M130 Polyarthritis, unspecified: Secondary | ICD-10-CM | POA: Diagnosis not present

## 2018-07-15 DIAGNOSIS — H04123 Dry eye syndrome of bilateral lacrimal glands: Secondary | ICD-10-CM | POA: Diagnosis not present

## 2018-07-15 DIAGNOSIS — H02834 Dermatochalasis of left upper eyelid: Secondary | ICD-10-CM | POA: Diagnosis not present

## 2018-07-15 DIAGNOSIS — H02831 Dermatochalasis of right upper eyelid: Secondary | ICD-10-CM | POA: Diagnosis not present

## 2018-07-15 DIAGNOSIS — I10 Essential (primary) hypertension: Secondary | ICD-10-CM | POA: Diagnosis not present

## 2018-07-15 DIAGNOSIS — E039 Hypothyroidism, unspecified: Secondary | ICD-10-CM | POA: Diagnosis not present

## 2018-07-15 DIAGNOSIS — Z79899 Other long term (current) drug therapy: Secondary | ICD-10-CM | POA: Diagnosis not present

## 2018-07-15 DIAGNOSIS — M0589 Other rheumatoid arthritis with rheumatoid factor of multiple sites: Secondary | ICD-10-CM | POA: Diagnosis not present

## 2018-08-03 ENCOUNTER — Other Ambulatory Visit (HOSPITAL_COMMUNITY): Payer: Self-pay | Admitting: Surgery

## 2018-08-03 ENCOUNTER — Telehealth (HOSPITAL_COMMUNITY): Payer: Self-pay | Admitting: Surgery

## 2018-08-03 DIAGNOSIS — M351 Other overlap syndromes: Secondary | ICD-10-CM

## 2018-08-03 NOTE — Telephone Encounter (Signed)
Received referral - pt has mixed connective tissue disease and needs echo, pft's and appt w/Dr Bensimhon.  Orders placed will schedule

## 2018-08-05 ENCOUNTER — Other Ambulatory Visit: Payer: Self-pay

## 2018-08-05 DIAGNOSIS — Z79899 Other long term (current) drug therapy: Secondary | ICD-10-CM

## 2018-08-05 LAB — CBC WITH DIFFERENTIAL/PLATELET
Basophils Absolute: 41 cells/uL (ref 0–200)
Basophils Relative: 0.6 %
EOS PCT: 2.7 %
Eosinophils Absolute: 184 cells/uL (ref 15–500)
HCT: 39.2 % (ref 35.0–45.0)
Hemoglobin: 12.6 g/dL (ref 11.7–15.5)
Lymphs Abs: 959 cells/uL (ref 850–3900)
MCH: 27.8 pg (ref 27.0–33.0)
MCHC: 32.1 g/dL (ref 32.0–36.0)
MCV: 86.3 fL (ref 80.0–100.0)
MPV: 12.6 fL — ABNORMAL HIGH (ref 7.5–12.5)
Monocytes Relative: 9.6 %
NEUTROS PCT: 73 %
Neutro Abs: 4964 cells/uL (ref 1500–7800)
PLATELETS: 219 10*3/uL (ref 140–400)
RBC: 4.54 10*6/uL (ref 3.80–5.10)
RDW: 13.1 % (ref 11.0–15.0)
TOTAL LYMPHOCYTE: 14.1 %
WBC mixed population: 653 cells/uL (ref 200–950)
WBC: 6.8 10*3/uL (ref 3.8–10.8)

## 2018-08-05 LAB — COMPLETE METABOLIC PANEL WITH GFR
AG Ratio: 1.9 (calc) (ref 1.0–2.5)
ALBUMIN MSPROF: 4.8 g/dL (ref 3.6–5.1)
ALKALINE PHOSPHATASE (APISO): 88 U/L (ref 33–130)
ALT: 9 U/L (ref 6–29)
AST: 16 U/L (ref 10–35)
BILIRUBIN TOTAL: 0.6 mg/dL (ref 0.2–1.2)
BUN / CREAT RATIO: 16 (calc) (ref 6–22)
BUN: 15 mg/dL (ref 7–25)
CHLORIDE: 104 mmol/L (ref 98–110)
CO2: 27 mmol/L (ref 20–32)
Calcium: 9.8 mg/dL (ref 8.6–10.4)
Creat: 0.94 mg/dL — ABNORMAL HIGH (ref 0.60–0.93)
GFR, Est African American: 71 mL/min/{1.73_m2} (ref >=60)
GFR, Est Non African American: 61 mL/min/{1.73_m2} (ref >=60)
GLOBULIN: 2.5 g/dL (ref 1.9–3.7)
GLUCOSE: 80 mg/dL (ref 65–99)
Potassium: 5.1 mmol/L (ref 3.5–5.3)
SODIUM: 140 mmol/L (ref 135–146)
Total Protein: 7.3 g/dL (ref 6.1–8.1)

## 2018-08-08 ENCOUNTER — Telehealth: Payer: Self-pay | Admitting: Rheumatology

## 2018-08-08 NOTE — Telephone Encounter (Signed)
Contacted patient and reviewed lab results with patient.  

## 2018-08-08 NOTE — Telephone Encounter (Signed)
Patient called stating she was returning your call regarding her lab results. °

## 2018-09-23 NOTE — Progress Notes (Signed)
Office Visit Note  Patient: Jennifer Mullen             Date of Birth: [birth year]           MRN: [id number]             PCP: Renaye Rakers, MD Referring: Renaye Rakers, MD Visit Date: 10/07/2018 Occupation: @GUAROCC @  Subjective:  Pain in both hands and Raynauds.    History of Present Illness: Jennifer Mullen is a 73 y.o. female with history of mixed connective tissue disease.  She states she continues to have Raynaud's phenomenon in her bilateral hands and bilateral feet.  She also has been having discomfort in her both hands which she describes over bilateral CMC joints.  Activities of Daily Living:  Patient reports morning stiffness for 5-10 minutes.   Patient Reports nocturnal pain.  Difficulty dressing/grooming: Denies Difficulty climbing stairs: Denies Difficulty getting out of chair: Denies Difficulty using hands for taps, buttons, cutlery, and/or writing: Reports  Review of Systems  Constitutional: Positive for fatigue. Negative for night sweats, weight gain and weight loss.  HENT: Positive for mouth dryness. Negative for mouth sores, trouble swallowing, trouble swallowing and nose dryness.   Eyes: Negative for pain, redness, itching, visual disturbance and dryness.  Respiratory: Negative for cough, shortness of breath, wheezing and difficulty breathing.   Cardiovascular: Negative for chest pain, palpitations, hypertension, irregular heartbeat and swelling in legs/feet.  Gastrointestinal: Negative for abdominal pain, blood in stool, constipation and diarrhea.  Endocrine: Negative for increased urination.  Genitourinary: Negative for painful urination, nocturia and vaginal dryness.  Musculoskeletal: Positive for arthralgias, joint pain and morning stiffness. Negative for joint swelling, myalgias, muscle weakness, muscle tenderness and myalgias.  Skin: Positive for color change. Negative for rash, hair loss, skin tightness, ulcers and sensitivity to sunlight.    Allergic/Immunologic: Negative for susceptible to infections.  Neurological: Negative for dizziness, light-headedness, headaches, memory loss, night sweats and weakness.  Hematological: Negative for bruising/bleeding tendency and swollen glands.  Psychiatric/Behavioral: Negative for depressed mood, confusion and sleep disturbance. The patient is not nervous/anxious.     PMFS History:  Patient Active Problem List   Diagnosis Date Noted  . Mixed connective tissue disease (HCC) 06/30/2018  . HYPOTHYROIDISM 05/21/2010  . ANOREXIA 12/21/2009  . THYROMEGALY 12/03/2009  . WEIGHT LOSS 12/03/2009    Past Medical History:  Diagnosis Date  . Hypercholesteremia   . Hypertension   . Syncope   . Thyroid disease     Family History  Problem Relation Age of Onset  . Leukemia Mother   . Heart Problems Father   . Diabetes Son    Past Surgical History:  Procedure Laterality Date  . ABDOMINAL HYSTERECTOMY    . APPENDECTOMY    . CHOLECYSTECTOMY    . TUBAL LIGATION     Social History   Social History Narrative  . Not on file    There is no immunization history on file for this patient.   Objective: Vital Signs: BP 119/69 (BP Location: Left Arm, Patient Position: Sitting, Cuff Size: Normal)   Pulse 76   Resp 14   Ht 5\' 2"  (1.575 m)   Wt 105 lb (47.6 kg)   BMI 19.20 kg/m    Physical Exam Vitals signs and nursing note reviewed.  Constitutional:      Appearance: She is well-developed.  HENT:     Head: Normocephalic and atraumatic.  Eyes:     Conjunctiva/sclera: Conjunctivae normal.  Neck:  Musculoskeletal: Normal range of motion.  Cardiovascular:     Rate and Rhythm: Normal rate and regular rhythm.     Heart sounds: Normal heart sounds.  Pulmonary:     Effort: Pulmonary effort is normal.     Breath sounds: Normal breath sounds.  Abdominal:     General: Bowel sounds are normal.     Palpations: Abdomen is soft.  Lymphadenopathy:     Cervical: No cervical adenopathy.   Skin:    General: Skin is warm and dry.     Capillary Refill: Capillary refill takes less than 2 seconds.  Neurological:     Mental Status: She is alert and oriented to person, place, and time.  Psychiatric:        Behavior: Behavior normal.      Musculoskeletal Exam: C-spine thoracic lumbar spine good range of motion.  Shoulder joints elbow joints wrist joints were in good range of motion.  She has bilateral PIP DIP and CMC thickening consistent with osteoarthritis.  Hip joints knee joints ankles MTPs PIPs been good range of motion. CDAI Exam: CDAI Score: Not documented Patient Global Assessment: Not documented; Provider Global Assessment: Not documented Swollen: Not documented; Tender: Not documented Joint Exam   Not documented   There is currently no information documented on the homunculus. Go to the Rheumatology activity and complete the homunculus joint exam.  Investigation: No additional findings.  Imaging: No results found.  Recent Labs: Lab Results  Component Value Date   WBC 6.8 08/05/2018   HGB 12.6 08/05/2018   PLT 219 08/05/2018   NA 140 08/05/2018   K 5.1 08/05/2018   CL 104 08/05/2018   CO2 27 08/05/2018   GLUCOSE 80 08/05/2018   BUN 15 08/05/2018   CREATININE 0.94 (H) 08/05/2018   BILITOT 0.6 08/05/2018   ALKPHOS 114 05/12/2014   AST 16 08/05/2018   ALT 9 08/05/2018   PROT 7.3 08/05/2018   ALBUMIN 4.3 05/12/2014   CALCIUM 9.8 08/05/2018   GFRAA 71 08/05/2018    Speciality Comments: PLQ eye exam: 07/15/2018 normal. Follow up in 1 year.  Procedures:  No procedures performed Allergies: Patient has no known allergies.   Assessment / Plan:     Visit Diagnoses: Mixed connective tissue disease (HCC) - History of fatigue, sicca symptoms, malar rash, Raynaud's phenomenon, positive ANA, positive RNP.  Patient is clinically doing well.  She continues to have some Raynauds phenomenon which is bothersome to her.  Keeping body temperature warm and wearing  gloves and socks was discussed.  High risk medication use - PLQ 200 mg p.o. daily. eye exam: 11/22/2019Plaquenil 200 mg daily. 9.  Most recent CBC/CMP within normal limits except for borderline elevated creatinine on 08/05/2018.  Will monitor every 5 months.  Patient was advised to avoid NSAIDs.  Raynaud's disease without gangrene -she has been having increased symptoms with the weather change.  At this point I would hold off amlodipine.  She had no digital ulcers or digital cyanosis on examination.  Poor dentition - Wear dentures  History of hypothyroidism  Dyslipidemia - she is on Crestor.  Weight loss - Due to difficulty eating   Orders: No orders of the defined types were placed in this encounter.  No orders of the defined types were placed in this encounter.    Follow-Up Instructions: Return in about 5 months (around 03/07/2019).   Pollyann SavoyShaili Michaeal Davis, MD  Note - This record has been created using Animal nutritionistDragon software.  Chart creation errors have been sought,  but may not always  have been located. Such creation errors do not reflect on  the standard of medical care.

## 2018-10-07 ENCOUNTER — Encounter: Payer: Self-pay | Admitting: Rheumatology

## 2018-10-07 ENCOUNTER — Ambulatory Visit: Payer: Medicare PPO | Admitting: Rheumatology

## 2018-10-07 VITALS — BP 119/69 | HR 76 | Resp 14 | Ht 62.0 in | Wt 105.0 lb

## 2018-10-07 DIAGNOSIS — Z79899 Other long term (current) drug therapy: Secondary | ICD-10-CM

## 2018-10-07 DIAGNOSIS — I7300 Raynaud's syndrome without gangrene: Secondary | ICD-10-CM | POA: Diagnosis not present

## 2018-10-07 DIAGNOSIS — E785 Hyperlipidemia, unspecified: Secondary | ICD-10-CM

## 2018-10-07 DIAGNOSIS — Z8639 Personal history of other endocrine, nutritional and metabolic disease: Secondary | ICD-10-CM

## 2018-10-07 DIAGNOSIS — R634 Abnormal weight loss: Secondary | ICD-10-CM

## 2018-10-07 DIAGNOSIS — M351 Other overlap syndromes: Secondary | ICD-10-CM

## 2018-10-07 DIAGNOSIS — M19041 Primary osteoarthritis, right hand: Secondary | ICD-10-CM | POA: Diagnosis not present

## 2018-10-07 DIAGNOSIS — K089 Disorder of teeth and supporting structures, unspecified: Secondary | ICD-10-CM | POA: Diagnosis not present

## 2018-10-07 DIAGNOSIS — M19042 Primary osteoarthritis, left hand: Secondary | ICD-10-CM

## 2018-10-07 NOTE — Patient Instructions (Signed)
Standing Labs We placed an order today for your standing lab work.    Please come back and get your standing labs in May and every 5 months  We have open lab Monday through Friday from 8:30-11:30 AM and 1:30-4:00 PM  at the office of Dr. Pollyann Savoy.   You may experience shorter wait times on Monday and Friday afternoons. The office is located at [address], [place], [place] [address] No appointment is necessary.   Labs are drawn by [facility].  You may receive a bill from [facility] for your lab work.  If you wish to have your labs drawn at another location, please call the office 24 hours in advance to send orders.  If you have any questions regarding directions or hours of operation,  please call [phone].   Just as a reminder please drink plenty of water prior to coming for your lab work. Thanks!

## 2018-10-27 ENCOUNTER — Ambulatory Visit (HOSPITAL_COMMUNITY)
Admission: RE | Admit: 2018-10-27 | Discharge: 2018-10-27 | Disposition: A | Discharge status: Home / Self Care | Payer: Medicare PPO | Source: Ambulatory Visit | Attending: Internal Medicine | Admitting: Internal Medicine

## 2018-10-27 ENCOUNTER — Ambulatory Visit (HOSPITAL_BASED_OUTPATIENT_CLINIC_OR_DEPARTMENT_OTHER)
Admission: RE | Admit: 2018-10-27 | Discharge: 2018-10-27 | Disposition: A | Discharge status: Home / Self Care | Payer: Medicare PPO | Source: Ambulatory Visit | Attending: Internal Medicine | Admitting: Internal Medicine

## 2018-10-27 ENCOUNTER — Other Ambulatory Visit: Payer: Self-pay

## 2018-10-27 VITALS — BP 130/82 | HR 67 | Wt 105.2 lb

## 2018-10-27 DIAGNOSIS — E079 Disorder of thyroid, unspecified: Secondary | ICD-10-CM | POA: Insufficient documentation

## 2018-10-27 DIAGNOSIS — I10 Essential (primary) hypertension: Secondary | ICD-10-CM | POA: Diagnosis not present

## 2018-10-27 DIAGNOSIS — I2721 Secondary pulmonary arterial hypertension: Secondary | ICD-10-CM

## 2018-10-27 DIAGNOSIS — I7300 Raynaud's syndrome without gangrene: Secondary | ICD-10-CM | POA: Insufficient documentation

## 2018-10-27 DIAGNOSIS — R079 Chest pain, unspecified: Secondary | ICD-10-CM

## 2018-10-27 DIAGNOSIS — Z833 Family history of diabetes mellitus: Secondary | ICD-10-CM | POA: Insufficient documentation

## 2018-10-27 DIAGNOSIS — M349 Systemic sclerosis, unspecified: Secondary | ICD-10-CM | POA: Insufficient documentation

## 2018-10-27 DIAGNOSIS — Z79899 Other long term (current) drug therapy: Secondary | ICD-10-CM | POA: Diagnosis not present

## 2018-10-27 DIAGNOSIS — M351 Other overlap syndromes: Secondary | ICD-10-CM | POA: Diagnosis not present

## 2018-10-27 DIAGNOSIS — I358 Other nonrheumatic aortic valve disorders: Secondary | ICD-10-CM | POA: Insufficient documentation

## 2018-10-27 DIAGNOSIS — F419 Anxiety disorder, unspecified: Secondary | ICD-10-CM | POA: Insufficient documentation

## 2018-10-27 DIAGNOSIS — E7800 Pure hypercholesterolemia, unspecified: Secondary | ICD-10-CM | POA: Insufficient documentation

## 2018-10-27 DIAGNOSIS — Z8249 Family history of ischemic heart disease and other diseases of the circulatory system: Secondary | ICD-10-CM | POA: Insufficient documentation

## 2018-10-27 DIAGNOSIS — Z806 Family history of leukemia: Secondary | ICD-10-CM | POA: Insufficient documentation

## 2018-10-27 DIAGNOSIS — R9431 Abnormal electrocardiogram [ECG] [EKG]: Secondary | ICD-10-CM | POA: Diagnosis not present

## 2018-10-27 LAB — PULMONARY FUNCTION TEST
DL/VA % PRED: 104 %
DL/VA: 4.43 ml/min/mmHg/L
DLCO UNC % PRED: 101 %
DLCO UNC: 17.76 ml/min/mmHg
FEF 25-75 POST: 1.79 L/s
FEF 25-75 PRE: 1.46 L/s
FEF2575-%Change-Post: 23 %
FEF2575-%PRED-PRE: 86 %
FEF2575-%Pred-Post: 106 %
FEV1-%Change-Post: 3 %
FEV1-%PRED-PRE: 94 %
FEV1-%Pred-Post: 97 %
FEV1-Post: 1.91 L
FEV1-Pre: 1.84 L
FEV1FVC-%Change-Post: 0 %
FEV1FVC-%Pred-Pre: 100 %
FEV6-%CHANGE-POST: 3 %
FEV6-%PRED-POST: 101 %
FEV6-%PRED-PRE: 97 %
FEV6-POST: 2.51 L
FEV6-Pre: 2.43 L
FEV6FVC-%PRED-POST: 104 %
FEV6FVC-%Pred-Pre: 104 %
FVC-%Change-Post: 3 %
FVC-%Pred-Post: 96 %
FVC-%Pred-Pre: 93 %
FVC-POST: 2.52 L
FVC-Pre: 2.43 L
PRE FEV1/FVC RATIO: 76 %
Post FEV1/FVC ratio: 76 %
Post FEV6/FVC ratio: 100 %
Pre FEV6/FVC Ratio: 100 %
RV % pred: 121 %
RV: 2.5 L
TLC % PRED: 105 %
TLC: 4.85 L

## 2018-10-27 MED ORDER — ALBUTEROL SULFATE (2.5 MG/3ML) 0.083% IN NEBU
2.5000 mg | INHALATION_SOLUTION | Freq: Once | RESPIRATORY_TRACT | Status: AC
  Administered 2018-10-27: 2.5 mg via RESPIRATORY_TRACT

## 2018-10-27 MED ORDER — AMLODIPINE BESYLATE 2.5 MG PO TABS: 2.5000 mg | ORAL_TABLET | Freq: Every day | ORAL | 3 refills | Status: DC

## 2018-10-27 NOTE — Progress Notes (Signed)
  Echocardiogram 2D Echocardiogram has been performed.  Jennifer Mullen 10/27/2018, 10:55 AM

## 2018-10-27 NOTE — Progress Notes (Signed)
PULMONARY HYPERTENSION CLINIC CONSULT NOTE  Referring Physician: Dr Titus Dubin  Primary Care: Dr. Parke Simmers Primary Cardiologist: New  HPI:    VENYA FIEGL is a 73 y.o. female with hypothyroidism, HL and mixed connective tissue disease referred by Dr. Corliss Skains for [facility] screening.   Denies any h/o known heart disease. Non-smoker. Has had problems with Raynaud's for years. Saw Dr. Corliss Skains for first time in 12/19. No problems with ADLs but gets SOB with walking. Has some left-sided CP she says is off-on. She thinks it is her muscles. Occurs 1-2x/month. Happens when she lifts something heavy. No severe episodes. Hasn't gotten much worse over past 6 months. Strong family hx of CAD.    ECHO: EF 60-65% RV normal. Grade I DD. IVC small Personally reviewed PFTs FEV1 1.84 L (94%) FVC  2.43 L (93%) DLCO 101%    Review of Systems: [y] = yes, [ ]  = no   General: Weight gain [ ] ; Weight loss [ ] ; Anorexia [ ] ; Fatigue [ ] ; Fever [ ] ; Chills [ ] ; Weakness [ ]   Cardiac: Chest pain/pressure [ ] ; Resting SOB [ ] ; Exertional SOB [ y]; Orthopnea [ ] ; Pedal Edema [ ] ; Palpitations [ ] ; Syncope [ ] ; Presyncope [ ] ; Paroxysmal nocturnal dyspnea[ ]   Pulmonary: Cough [ ] ; Wheezing[ ] ; Hemoptysis[ ] ; Sputum [ ] ; Snoring [ ]   GI: Vomiting[ ] ; Dysphagia[ ] ; Melena[ ] ; Hematochezia [ ] ; Heartburn[ ] ; Abdominal pain [ ] ; Constipation [ ] ; Diarrhea [ ] ; BRBPR [ ]   GU: Hematuria[ ] ; Dysuria [ ] ; Nocturia[ ]   Vascular: Pain in legs with walking [ ] ; Pain in feet with lying flat [ ] ; Non-healing sores [ ] ; Stroke [ ] ; TIA [ ] ; Slurred speech [ ] ;  Neuro: Headaches[ ] ; Vertigo[ ] ; Seizures[ ] ; Paresthesias[ ] ;Blurred vision [ ] ; Diplopia [ ] ; Vision changes [ ]   Ortho/Skin: Arthritis [ y]; Joint pain [ y]; Muscle pain [ ] ; Joint swelling [ ] ; Back Pain [ ] ; Rash [ ]   Psych: Depression[ ] ; Anxiety[ ]   Heme: Bleeding problems [ ] ; Clotting disorders [ ] ; Anemia [ ]   Endocrine: Diabetes [ ] ; Thyroid dysfunction[y  ]   Past Medical History:  Diagnosis Date  . Hypercholesteremia   . Hypertension   . Syncope   . Thyroid disease     Current Outpatient Medications  Medication Sig Dispense Refill  . Acetaminophen (TYLENOL PO) Take by mouth as needed.    . CRESTOR 20 MG tablet Take 1 tablet by mouth daily.    . hydroxychloroquine (PLAQUENIL) 200 MG tablet Take 1 tablet (200 mg total) by mouth daily. 90 tablet 0  . LORazepam (ATIVAN) 1 MG tablet Take 1 mg by mouth at bedtime as needed for anxiety.    . SYNTHROID 50 MCG tablet every other day. Alternating with Synthroid every other day  2   No current facility-administered medications for this encounter.     No Known Allergies    Social History   Socioeconomic History  . Marital status: Divorced    Spouse name: Not on file  . Number of children: Not on file  . Years of education: Not on file  . Highest education level: Not on file  Occupational History  . Not on file  Social Needs  . Financial resource strain: Not on file  . Food insecurity:    Worry: Not on file    Inability: Not on file  . Transportation needs:    Medical: Not on file  Non-medical: Not on file  Tobacco Use  . Smoking status: Never Smoker  . Smokeless tobacco: Never Used  Substance and Sexual Activity  . Alcohol use: No  . Drug use: No  . Sexual activity: Not on file  Lifestyle  . Physical activity:    Days per week: Not on file    Minutes per session: Not on file  . Stress: Not on file  Relationships  . Social connections:    Talks on phone: Not on file    Gets together: Not on file    Attends religious service: Not on file    Active member of club or organization: Not on file    Attends meetings of clubs or organizations: Not on file    Relationship status: Not on file  . Intimate partner violence:    Fear of current or ex partner: Not on file    Emotionally abused: Not on file    Physically abused: Not on file    Forced sexual activity: Not  on file  Other Topics Concern  . Not on file  Social History Narrative  . Not on file      Family History  Problem Relation Age of Onset  . Leukemia Mother   . Heart Problems Father   . Diabetes Son    Sister died from heart problems 2 sisters with strokes   Vitals:   10/27/18 1114  BP: 130/82  Pulse: 67  SpO2: 95%  Weight: 47.7 kg (105 lb 3.2 oz)    PHYSICAL EXAM: General:  Well appearing. No respiratory difficulty HEENT: normal Neck: supple. no JVD. Carotids 2+ bilat; no bruits. No lymphadenopathy or thryomegaly appreciated. Cor: PMI nondisplaced. Regular rate & rhythm. No rubs, gallops or murmurs. Lungs: clear Abdomen: soft, nontender, nondistended. No hepatosplenomegaly. No bruits or masses. Good bowel sounds. Extremities: no cyanosis, clubbing, rash, edema Neuro: alert & oriented x 3, cranial nerves grossly intact. moves all 4 extremities w/o difficulty. Affect pleasant.  ECG: NSR 67 nonspecific ST scooping inferolaterally. Personally reviewed   ASSESSMENT & PLAN:  1. MCTD with Raynaud's  - Followed by Dr. Corliss Skains - Echo and PFTs reviewed personally today and no evidence of PAH. Repeat screening in one year - Add amlodipine 2.5 mg daily for Raynaud's  2. Exertional chest pain - Multiple cardiac risk factors with strong family hx - Will proceed with ECG and CTA coronaries   Arvilla Meres, MD  11:35 AM

## 2018-10-27 NOTE — Patient Instructions (Signed)
EKG was completed.  BEGIN taking Amlodipine 2.5mg  daily.   You have been referred to Coronary CTA for your exertional chest pain, this office will you call you to schedule an appointment with you.  Your physician has requested that you have an echocardiogram. Echocardiography is a painless test that uses sound waves to create images of your heart. It provides your doctor with information about the size and shape of your heart and how well your heart's chambers and valves are working. This procedure takes approximately one hour. There are no restrictions for this procedure. This will be done in 1 year.  Your physician has requested that you complete a Pulmonary Functions Test in 1 year.  Your physician wants you to follow-up in: 1 YEAR You will receive a reminder letter in the mail two months in advance. If you don't receive a letter, please call our office to schedule the follow-up appointment.

## 2018-11-09 ENCOUNTER — Telehealth: Payer: Self-pay | Admitting: Rheumatology

## 2018-11-09 DIAGNOSIS — M351 Other overlap syndromes: Secondary | ICD-10-CM

## 2018-11-09 MED ORDER — HYDROXYCHLOROQUINE SULFATE 200 MG PO TABS: 200.0000 mg | ORAL_TABLET | Freq: Every day | ORAL | 0 refills | Status: DC

## 2018-11-09 NOTE — Telephone Encounter (Signed)
Patient request a refill on generic Plaquenil 200 mg sent to [facility] in [place].

## 2018-11-09 NOTE — Telephone Encounter (Signed)
Last Visit: 10/07/18 Next Visit: 03/10/19 Labs: 08/06/19 Creatinine is borderline elevated. GFR WNL. CBC WNL  PLQ eye exam: 07/15/2018 normal.  Okay to refill per Dr. Corliss Skains

## 2019-02-17 DIAGNOSIS — F5102 Adjustment insomnia: Secondary | ICD-10-CM | POA: Diagnosis not present

## 2019-02-17 DIAGNOSIS — E039 Hypothyroidism, unspecified: Secondary | ICD-10-CM | POA: Diagnosis not present

## 2019-02-17 DIAGNOSIS — I10 Essential (primary) hypertension: Secondary | ICD-10-CM | POA: Diagnosis not present

## 2019-02-17 DIAGNOSIS — M150 Primary generalized (osteo)arthritis: Secondary | ICD-10-CM | POA: Diagnosis not present

## 2019-02-27 NOTE — Progress Notes (Deleted)
Office Visit Note  Patient: Jennifer Mullen             Date of Birth: 05/17/1946           MRN: [id number]             PCP: Renaye RakersBland, Veita, MD Referring: Renaye RakersBland, Veita, MD Visit Date: 03/10/2019 Occupation: @GUAROCC @  Subjective:  No chief complaint on file.   Plaquenil 200 mg 1 tablet daily.  Last Plaquenil eye exam normal on 07/15/2018.  Most recent CBC within normal limits and CMP showed elevated creatinine on 08/05/2018.  Due for CBC/CMP today and will monitor every 5 months.  Standing orders placed.  She received a high-dose flu vaccine in September.  Recommend annual influenza, Pneumovax 23, Prevnar 13, and Shingrix as indicated for immunosuppressant therapy.    History of Present Illness: Jennifer Mullen is a 73 y.o. female ***   Activities of Daily Living:  Patient reports morning stiffness for *** {minute/hour:19697}.   Patient {ACTIONS;DENIES/REPORTS:21021675::"Denies"} nocturnal pain.  Difficulty dressing/grooming: {ACTIONS;DENIES/REPORTS:21021675::"Denies"} Difficulty climbing stairs: {ACTIONS;DENIES/REPORTS:21021675::"Denies"} Difficulty getting out of chair: {ACTIONS;DENIES/REPORTS:21021675::"Denies"} Difficulty using hands for taps, buttons, cutlery, and/or writing: {ACTIONS;DENIES/REPORTS:21021675::"Denies"}  No Rheumatology ROS completed.   PMFS History:  Patient Active Problem List   Diagnosis Date Noted  . Mixed connective tissue disease (HCC) 06/30/2018  . HYPOTHYROIDISM 05/21/2010  . ANOREXIA 12/21/2009  . THYROMEGALY 12/03/2009  . WEIGHT LOSS 12/03/2009    Past Medical History:  Diagnosis Date  . Hypercholesteremia   . Hypertension   . Syncope   . Thyroid disease     Family History  Problem Relation Age of Onset  . Leukemia Mother   . Heart Problems Father   . Diabetes Son    Past Surgical History:  Procedure Laterality Date  . ABDOMINAL HYSTERECTOMY    . APPENDECTOMY    . CHOLECYSTECTOMY    . TUBAL LIGATION     Social History   Social  History Narrative  . Not on file    There is no immunization history on file for this patient.   Objective: Vital Signs: There were no vitals taken for this visit.   Physical Exam   Musculoskeletal Exam: ***  CDAI Exam: CDAI Score: - Patient Global: -; Provider Global: - Swollen: -; Tender: - Joint Exam   No joint exam has been documented for this visit   There is currently no information documented on the homunculus. Go to the Rheumatology activity and complete the homunculus joint exam.  Investigation: No additional findings.  Imaging: No results found.  Recent Labs: Lab Results  Component Value Date   WBC 6.8 08/05/2018   HGB 12.6 08/05/2018   PLT 219 08/05/2018   NA 140 08/05/2018   K 5.1 08/05/2018   CL 104 08/05/2018   CO2 27 08/05/2018   GLUCOSE 80 08/05/2018   BUN 15 08/05/2018   CREATININE 0.94 (H) 08/05/2018   BILITOT 0.6 08/05/2018   ALKPHOS 114 05/12/2014   AST 16 08/05/2018   ALT 9 08/05/2018   PROT 7.3 08/05/2018   ALBUMIN 4.3 05/12/2014   CALCIUM 9.8 08/05/2018   GFRAA 71 08/05/2018    Speciality Comments: PLQ eye exam: 07/15/2018 normal. Follow up in 1 year.  Procedures:  No procedures performed Allergies: Patient has no known allergies.   Assessment / Plan:     Visit Diagnoses: No diagnosis found.    Poor dentition - Wear dentures  History of hypothyroidism  Dyslipidemia - she is on Crestor.  Weight  loss - Due to difficulty eating  Orders: No orders of the defined types were placed in this encounter.  No orders of the defined types were placed in this encounter.   Face-to-face time spent with patient was *** minutes. Greater than 50% of time was spent in counseling and coordination of care.  Follow-Up Instructions: No follow-ups on file.   Earnestine Mealing, CMA  Note - This record has been created using Editor, commissioning.  Chart creation errors have been sought, but may not always  have been located. Such creation  errors do not reflect on  the standard of medical care.

## 2019-03-10 ENCOUNTER — Ambulatory Visit: Payer: Self-pay | Admitting: Rheumatology

## 2019-04-20 ENCOUNTER — Other Ambulatory Visit: Payer: Self-pay | Admitting: Rheumatology

## 2019-04-20 DIAGNOSIS — M351 Other overlap syndromes: Secondary | ICD-10-CM

## 2019-04-20 NOTE — Telephone Encounter (Signed)
ok 

## 2019-04-20 NOTE — Telephone Encounter (Signed)
Last Visit: 10/07/18 Next Visit: 05/05/19 Labs: 08/05/18 Creatinine is borderline elevated. GFR WNL. Please advise patient to avoid NSAIDs. We will continue to monitor. CBC WNL  PLQ eye exam: 07/15/2018 normal.  Patient advised she is due to update labs and has been scheduled for a follow up appointment. The soonest she could schedule was 05/05/19 due to needing to save the money for her copay.  Okay to refill 30 day PLQ?

## 2019-04-20 NOTE — Telephone Encounter (Signed)
Patient requesting a refill on Generic Plaquenil sent to [facility] in [place].

## 2019-04-21 MED ORDER — HYDROXYCHLOROQUINE SULFATE 200 MG PO TABS: 200.0000 mg | ORAL_TABLET | Freq: Every day | ORAL | 0 refills | Status: DC

## 2019-04-21 NOTE — Progress Notes (Deleted)
Office Visit Note  Patient: Jennifer Mullen             Date of Birth: 1946-05-14           MRN: [id number]             PCP: Lucianne Lei, MD Referring: Lucianne Lei, MD Visit Date: 05/05/2019 Occupation: @GUAROCC @  Subjective:  No chief complaint on file.   History of Present Illness: Jennifer Mullen is a 73 y.o. female ***   Activities of Daily Living:  Patient reports morning stiffness for *** {minute/hour:19697}.   Patient {ACTIONS;DENIES/REPORTS:21021675::"Denies"} nocturnal pain.  Difficulty dressing/grooming: {ACTIONS;DENIES/REPORTS:21021675::"Denies"} Difficulty climbing stairs: {ACTIONS;DENIES/REPORTS:21021675::"Denies"} Difficulty getting out of chair: {ACTIONS;DENIES/REPORTS:21021675::"Denies"} Difficulty using hands for taps, buttons, cutlery, and/or writing: {ACTIONS;DENIES/REPORTS:21021675::"Denies"}  No Rheumatology ROS completed.   PMFS History:  Patient Active Problem List   Diagnosis Date Noted  . Mixed connective tissue disease ([place]) 06/30/2018  . HYPOTHYROIDISM 05/21/2010  . ANOREXIA 12/21/2009  . THYROMEGALY 12/03/2009  . WEIGHT LOSS 12/03/2009    Past Medical History:  Diagnosis Date  . Hypercholesteremia   . Hypertension   . Syncope   . Thyroid disease     Family History  Problem Relation Age of Onset  . Leukemia Mother   . Heart Problems Father   . Diabetes Son    Past Surgical History:  Procedure Laterality Date  . ABDOMINAL HYSTERECTOMY    . APPENDECTOMY    . CHOLECYSTECTOMY    . TUBAL LIGATION     Social History   Social History Narrative  . Not on file   Immunization History  Administered Date(s) Administered  . Influenza, High Dose Seasonal PF 05/10/2018     Objective: Vital Signs: There were no vitals taken for this visit.   Physical Exam   Musculoskeletal Exam: ***  CDAI Exam: CDAI Score: - Patient Global: -; Provider Global: - Swollen: -; Tender: - Joint Exam   No joint exam has been documented for this  visit   There is currently no information documented on the homunculus. Go to the Rheumatology activity and complete the homunculus joint exam.  Investigation: No additional findings.  Imaging: No results found.  Recent Labs: Lab Results  Component Value Date   WBC 6.8 08/05/2018   HGB 12.6 08/05/2018   PLT 219 08/05/2018   NA 140 08/05/2018   K 5.1 08/05/2018   CL 104 08/05/2018   CO2 27 08/05/2018   GLUCOSE 80 08/05/2018   BUN 15 08/05/2018   CREATININE 0.94 (H) 08/05/2018   BILITOT 0.6 08/05/2018   ALKPHOS 114 05/12/2014   AST 16 08/05/2018   ALT 9 08/05/2018   PROT 7.3 08/05/2018   ALBUMIN 4.3 05/12/2014   CALCIUM 9.8 08/05/2018   GFRAA 71 08/05/2018    Speciality Comments: PLQ eye exam: 07/15/2018 normal. Follow up in 1 year.  Procedures:  No procedures performed Allergies: Patient has no known allergies.   Assessment / Plan:     Visit Diagnoses: No diagnosis found.  Orders: No orders of the defined types were placed in this encounter.  No orders of the defined types were placed in this encounter.   Face-to-face time spent with patient was *** minutes. Greater than 50% of time was spent in counseling and coordination of care.  Follow-Up Instructions: No follow-ups on file.   Ofilia Neas, PA-C  Note - This record has been created using Dragon software.  Chart creation errors have been sought, but may not always  have been  located. Such creation errors do not reflect on  the standard of medical care.

## 2019-04-28 NOTE — Progress Notes (Signed)
Office Visit Note  Patient: Jennifer Mullen             Date of Birth: 1946/05/09           MRN: [id number]             PCP: Renaye RakersBland, Veita, MD Referring: Renaye RakersBland, Veita, MD Visit Date: 05/12/2019 Occupation: @GUAROCC @  Subjective: Pain in bilateral thumb.   History of Present Illness: Jennifer Creedina E Hayhurst is a 73 y.o. female with history of neck connective tissue disease and osteoarthritis.  Patient is taking Plaquenil 200 mg 1 tablet by mouth daily.  He denies any history of joint swelling or rash.  She states she has been having some discomfort over bilateral CMC joints.  She notices discoloration in her hands when exposed to the colder temperatures.  Patient was seen by Dr. Gala RomneyBensimhon in March 2020.  He did EKG and CTA coronaries.  He also reviewed echocardiogram and PFTs and there was no evidence of PAH.  He placed her on amlodipine 2.5 mg p.o. daily for Raynauds.  Activities of Daily Living:  Patient reports morning stiffness for 30 minutes.   Patient Reports nocturnal pain.  Difficulty dressing/grooming: Denies Difficulty climbing stairs: Denies Difficulty getting out of chair: Denies Difficulty using hands for taps, buttons, cutlery, and/or writing: Reports  Review of Systems  Constitutional: Negative for fatigue.  HENT: Positive for mouth dryness. Negative for mouth sores and nose dryness.   Eyes: Negative for pain, itching, visual disturbance and dryness.  Respiratory: Negative for cough, hemoptysis, shortness of breath, wheezing and difficulty breathing.   Cardiovascular: Negative for chest pain, palpitations, hypertension and swelling in legs/feet.  Gastrointestinal: Negative for blood in stool, constipation and diarrhea.  Endocrine: Negative for increased urination.  Genitourinary: Negative for difficulty urinating and painful urination.  Musculoskeletal: Positive for arthralgias, joint pain, joint swelling and morning stiffness. Negative for myalgias, muscle weakness, muscle  tenderness and myalgias.  Skin: Positive for color change. Negative for pallor, rash, hair loss, nodules/bumps, skin tightness, ulcers and sensitivity to sunlight.  Allergic/Immunologic: Negative for susceptible to infections.  Neurological: Negative for dizziness, headaches and weakness.  Hematological: Negative for swollen glands.  Psychiatric/Behavioral: Negative for depressed mood, confusion and sleep disturbance. The patient is not nervous/anxious.     PMFS History:  Patient Active Problem List   Diagnosis Date Noted  . Mixed connective tissue disease (HCC) 06/30/2018  . HYPOTHYROIDISM 05/21/2010  . ANOREXIA 12/21/2009  . THYROMEGALY 12/03/2009  . WEIGHT LOSS 12/03/2009    Past Medical History:  Diagnosis Date  . Hypercholesteremia   . Hypertension   . Syncope   . Thyroid disease     Family History  Problem Relation Age of Onset  . Leukemia Mother   . Heart Problems Father   . Diabetes Son    Past Surgical History:  Procedure Laterality Date  . ABDOMINAL HYSTERECTOMY    . APPENDECTOMY    . CHOLECYSTECTOMY    . TUBAL LIGATION     Social History   Social History Narrative  . Not on file   Immunization History  Administered Date(s) Administered  . Influenza, High Dose Seasonal PF 05/10/2018     Objective: Vital Signs: BP 119/75 (BP Location: Left Arm, Patient Position: Sitting, Cuff Size: Normal)   Pulse 90   Resp 14   Ht 5\' 2"  (1.575 m)   Wt 111 lb 12.8 oz (50.7 kg)   BMI 20.45 kg/m    Physical Exam Vitals signs and nursing note  reviewed.  Constitutional:      Appearance: She is well-developed.  HENT:     Head: Normocephalic and atraumatic.  Eyes:     Conjunctiva/sclera: Conjunctivae normal.  Neck:     Musculoskeletal: Normal range of motion.  Cardiovascular:     Rate and Rhythm: Normal rate and regular rhythm.     Heart sounds: Normal heart sounds.  Pulmonary:     Effort: Pulmonary effort is normal.     Breath sounds: Normal breath sounds.   Abdominal:     General: Bowel sounds are normal.     Palpations: Abdomen is soft.  Lymphadenopathy:     Cervical: No cervical adenopathy.  Skin:    General: Skin is warm and dry.     Capillary Refill: Capillary refill takes less than 2 seconds.  Neurological:     Mental Status: She is alert and oriented to person, place, and time.  Psychiatric:        Behavior: Behavior normal.      Musculoskeletal Exam: C-spine thoracic and lumbar spine with good range of motion.  Shoulder joints elbow joints wrist joints with good range of motion.  She has no synovitis.  She has DIP PIP and CMC thickening bilaterally.  Hip joints, knee joints, ankles, MTPs with good range of motion with no synovitis.  CDAI Exam: CDAI Score: - Patient Global: -; Provider Global: - Swollen: -; Tender: - Joint Exam   No joint exam has been documented for this visit   There is currently no information documented on the homunculus. Go to the Rheumatology activity and complete the homunculus joint exam.  Investigation: No additional findings.  Imaging: No results found.  Recent Labs: Lab Results  Component Value Date   WBC 6.8 08/05/2018   HGB 12.6 08/05/2018   PLT 219 08/05/2018   NA 140 08/05/2018   K 5.1 08/05/2018   CL 104 08/05/2018   CO2 27 08/05/2018   GLUCOSE 80 08/05/2018   BUN 15 08/05/2018   CREATININE 0.94 (H) 08/05/2018   BILITOT 0.6 08/05/2018   ALKPHOS 114 05/12/2014   AST 16 08/05/2018   ALT 9 08/05/2018   PROT 7.3 08/05/2018   ALBUMIN 4.3 05/12/2014   CALCIUM 9.8 08/05/2018   GFRAA 71 08/05/2018    Speciality Comments: PLQ eye exam: 07/15/2018 normal. Follow up in 1 year.  Procedures:  No procedures performed Allergies: Patient has no known allergies.   Assessment / Plan:     Visit Diagnoses: Mixed connective tissue disease ([place]) - History of fatigue, sicca symptoms, malar rash, Raynaud's phenomenon, positive ANA, positive RNP -patient does not appear to be having a flare  of autoimmune disease.  I will obtain following labs today.  Plan: Anti-DNA antibody, double-stranded, C3 and C4, Urinalysis, Routine w reflex microscopic, Sedimentation rate  High risk medication use - Plaquenil 200 mg 1 tablet daily. Last Plaquenil eye exam normal on 07/15/2018 by Dr. Katy Fitch.  I have advised her to schedule a follow-up appointment.  Most recent CBC/CMP within normal limits except mildly elevated creatinine on 08/05/2018.  Due for CBC/CMP today and will monitor every 5 months.  Plan: CBC with Differential/Platelet, COMPLETE METABOLIC PANEL WITH GFR  Osteoarthritis hands bilateral-she is been having increased CMC joint discomfort.  Topical analgesics were discussed.  I offered [place] brace which she declined.  Raynaud's disease without gangrene-she was placed on Norvasc 2.5 mg p.o. daily which has been helpful.  Osteoporosis, age-related without pathological fracture-She is not currently on therapy.  Last  DEXA on 08/28/2015 ordered by Dr. Parke Simmers showed T score of -2.8 at AP spine with 7.6% improvement in BMD.  No prior osteoporosis treatment on file.  I am uncertain if she is getting any treatment or had repeat bone density.  I have advised her to discuss repeat bone density with Dr. Parke Simmers.  Poor dentition - Wear dentures  Weight loss - Due to difficulty eating   History of hypothyroidism  Dyslipidemia - she is on Crestor.  Orders: Orders Placed This Encounter  Procedures  . CBC with Differential/Platelet  . COMPLETE METABOLIC PANEL WITH GFR  . Anti-DNA antibody, double-stranded  . C3 and C4  . Urinalysis, Routine w reflex microscopic  . Sedimentation rate   No orders of the defined types were placed in this encounter.    Follow-Up Instructions: Return in 5 months (on 10/12/2019) for Mixed connnective tissue disease , Osteoarthritis, Osteoporosis.   Pollyann Savoy, MD  Note - This record has been created using Animal nutritionist.  Chart creation errors have been sought,  but may not always  have been located. Such creation errors do not reflect on  the standard of medical care.

## 2019-05-05 ENCOUNTER — Ambulatory Visit: Payer: Medicare PPO | Admitting: Physician Assistant

## 2019-05-12 ENCOUNTER — Other Ambulatory Visit: Payer: Self-pay

## 2019-05-12 ENCOUNTER — Encounter: Payer: Self-pay | Admitting: Rheumatology

## 2019-05-12 ENCOUNTER — Ambulatory Visit (INDEPENDENT_AMBULATORY_CARE_PROVIDER_SITE_OTHER): Payer: Medicare PPO | Admitting: Rheumatology

## 2019-05-12 VITALS — BP 119/75 | HR 90 | Resp 14 | Ht 62.0 in | Wt 111.8 lb

## 2019-05-12 DIAGNOSIS — M351 Other overlap syndromes: Secondary | ICD-10-CM

## 2019-05-12 DIAGNOSIS — R634 Abnormal weight loss: Secondary | ICD-10-CM

## 2019-05-12 DIAGNOSIS — K089 Disorder of teeth and supporting structures, unspecified: Secondary | ICD-10-CM

## 2019-05-12 DIAGNOSIS — I7300 Raynaud's syndrome without gangrene: Secondary | ICD-10-CM

## 2019-05-12 DIAGNOSIS — E785 Hyperlipidemia, unspecified: Secondary | ICD-10-CM

## 2019-05-12 DIAGNOSIS — M810 Age-related osteoporosis without current pathological fracture: Secondary | ICD-10-CM | POA: Diagnosis not present

## 2019-05-12 DIAGNOSIS — M19041 Primary osteoarthritis, right hand: Secondary | ICD-10-CM | POA: Diagnosis not present

## 2019-05-12 DIAGNOSIS — Z79899 Other long term (current) drug therapy: Secondary | ICD-10-CM | POA: Diagnosis not present

## 2019-05-12 DIAGNOSIS — Z8639 Personal history of other endocrine, nutritional and metabolic disease: Secondary | ICD-10-CM | POA: Diagnosis not present

## 2019-05-12 DIAGNOSIS — M19042 Primary osteoarthritis, left hand: Secondary | ICD-10-CM

## 2019-05-15 LAB — CBC WITH DIFFERENTIAL/PLATELET
Absolute Monocytes: 429 cells/uL (ref 200–950)
Basophils Absolute: 41 cells/uL (ref 0–200)
Basophils Relative: 0.7 %
Eosinophils Absolute: 168 cells/uL (ref 15–500)
Eosinophils Relative: 2.9 %
HCT: 39.3 % (ref 35.0–45.0)
Hemoglobin: 13 g/dL (ref 11.7–15.5)
Lymphs Abs: 1015 cells/uL (ref 850–3900)
MCH: 28.3 pg (ref 27.0–33.0)
MCHC: 33.1 g/dL (ref 32.0–36.0)
MCV: 85.6 fL (ref 80.0–100.0)
MPV: 11.8 fL (ref 7.5–12.5)
Monocytes Relative: 7.4 %
Neutro Abs: 4147 cells/uL (ref 1500–7800)
Neutrophils Relative %: 71.5 %
Platelets: 257 10*3/uL (ref 140–400)
RBC: 4.59 10*6/uL (ref 3.80–5.10)
RDW: 13 % (ref 11.0–15.0)
Total Lymphocyte: 17.5 %
WBC: 5.8 10*3/uL (ref 3.8–10.8)

## 2019-05-15 LAB — C3 AND C4
C3 Complement: 124 mg/dL (ref 83–193)
C4 Complement: 35 mg/dL (ref 15–57)

## 2019-05-15 LAB — COMPLETE METABOLIC PANEL WITH GFR
AG Ratio: 2.1 (calc) (ref 1.0–2.5)
ALT: 9 U/L (ref 6–29)
AST: 18 U/L (ref 10–35)
Albumin: 4.7 g/dL (ref 3.6–5.1)
Alkaline phosphatase (APISO): 85 U/L (ref 37–153)
BUN/Creatinine Ratio: 14 (calc) (ref 6–22)
BUN: 13 mg/dL (ref 7–25)
CO2: 24 mmol/L (ref 20–32)
Calcium: 9.5 mg/dL (ref 8.6–10.4)
Chloride: 104 mmol/L (ref 98–110)
Creat: 0.95 mg/dL — ABNORMAL HIGH (ref 0.60–0.93)
GFR, Est African American: 69 mL/min/{1.73_m2} (ref >=60)
GFR, Est Non African American: 60 mL/min/{1.73_m2} (ref >=60)
Globulin: 2.2 g/dL (calc) (ref 1.9–3.7)
Glucose, Bld: 81 mg/dL (ref 65–99)
Potassium: 4.6 mmol/L (ref 3.5–5.3)
Sodium: 140 mmol/L (ref 135–146)
Total Bilirubin: 0.5 mg/dL (ref 0.2–1.2)
Total Protein: 6.9 g/dL (ref 6.1–8.1)

## 2019-05-15 LAB — URINALYSIS, ROUTINE W REFLEX MICROSCOPIC
Bacteria, UA: NONE SEEN /HPF
Bilirubin Urine: NEGATIVE
Glucose, UA: NEGATIVE
Hgb urine dipstick: NEGATIVE
Hyaline Cast: NONE SEEN /LPF
Ketones, ur: NEGATIVE
Nitrite: NEGATIVE
Protein, ur: NEGATIVE
RBC / HPF: NONE SEEN /HPF (ref 0–2)
Specific Gravity, Urine: 1.009 (ref 1.001–1.03)
Squamous Epithelial / HPF: NONE SEEN /HPF (ref <=5)
pH: 5.5 (ref 5.0–8.0)

## 2019-05-15 LAB — ANTI-DNA ANTIBODY, DOUBLE-STRANDED: ds DNA Ab: <1 IU/mL

## 2019-05-15 LAB — SEDIMENTATION RATE: Sed Rate: 6 mm/h (ref 0–30)

## 2019-05-15 NOTE — Progress Notes (Signed)
stable °

## 2019-05-19 ENCOUNTER — Telehealth: Payer: Self-pay | Admitting: Rheumatology

## 2019-05-19 DIAGNOSIS — M329 Systemic lupus erythematosus, unspecified: Secondary | ICD-10-CM | POA: Diagnosis not present

## 2019-05-19 DIAGNOSIS — E039 Hypothyroidism, unspecified: Secondary | ICD-10-CM | POA: Diagnosis not present

## 2019-05-19 DIAGNOSIS — I739 Peripheral vascular disease, unspecified: Secondary | ICD-10-CM | POA: Diagnosis not present

## 2019-05-19 DIAGNOSIS — I10 Essential (primary) hypertension: Secondary | ICD-10-CM | POA: Diagnosis not present

## 2019-05-19 NOTE — Telephone Encounter (Signed)
05/12/19 labs faxed to Dr. Fransico Setters office [phone]

## 2019-05-31 DIAGNOSIS — C44629 Squamous cell carcinoma of skin of left upper limb, including shoulder: Secondary | ICD-10-CM | POA: Diagnosis not present

## 2019-06-20 ENCOUNTER — Other Ambulatory Visit: Payer: Self-pay | Admitting: *Deleted

## 2019-06-20 DIAGNOSIS — Z20822 Contact with and (suspected) exposure to covid-19: Secondary | ICD-10-CM

## 2019-06-21 LAB — NOVEL CORONAVIRUS, NAA: SARS-CoV-2, NAA: NOT DETECTED

## 2019-06-29 ENCOUNTER — Other Ambulatory Visit (HOSPITAL_COMMUNITY): Payer: Self-pay | Admitting: Family Medicine

## 2019-06-29 DIAGNOSIS — Z1231 Encounter for screening mammogram for malignant neoplasm of breast: Secondary | ICD-10-CM

## 2019-06-30 DIAGNOSIS — C44619 Basal cell carcinoma of skin of left upper limb, including shoulder: Secondary | ICD-10-CM | POA: Diagnosis not present

## 2019-06-30 DIAGNOSIS — G4762 Sleep related leg cramps: Secondary | ICD-10-CM | POA: Diagnosis not present

## 2019-06-30 DIAGNOSIS — I10 Essential (primary) hypertension: Secondary | ICD-10-CM | POA: Diagnosis not present

## 2019-06-30 DIAGNOSIS — M150 Primary generalized (osteo)arthritis: Secondary | ICD-10-CM | POA: Diagnosis not present

## 2019-06-30 DIAGNOSIS — E039 Hypothyroidism, unspecified: Secondary | ICD-10-CM | POA: Diagnosis not present

## 2019-07-14 ENCOUNTER — Other Ambulatory Visit: Payer: Self-pay

## 2019-07-14 ENCOUNTER — Ambulatory Visit (HOSPITAL_COMMUNITY)
Admission: RE | Admit: 2019-07-14 | Discharge: 2019-07-14 | Disposition: A | Discharge status: Home / Self Care | Payer: Medicare PPO | Source: Ambulatory Visit | Attending: Family Medicine | Admitting: Family Medicine

## 2019-07-14 DIAGNOSIS — Z20822 Contact with and (suspected) exposure to covid-19: Secondary | ICD-10-CM

## 2019-07-14 DIAGNOSIS — Z1231 Encounter for screening mammogram for malignant neoplasm of breast: Secondary | ICD-10-CM | POA: Insufficient documentation

## 2019-07-17 LAB — NOVEL CORONAVIRUS, NAA: SARS-CoV-2, NAA: DETECTED — AB

## 2019-07-18 ENCOUNTER — Telehealth: Payer: Self-pay | Admitting: *Deleted

## 2019-07-18 NOTE — Telephone Encounter (Signed)
Pt returned call. Reviewed positive covid results with patient. Pt states she is asymptomatic.  Reviewed quarantine precautions; self isolate for 10 days from test date, with 3 consecutive days fever free without fever reducing medications. Any respiratory symptoms should be resolved at that time as well. Treat symptoms with over the counter medications. Leave home for medical issues only; if must go out wear mask, practice social distancing. Reviewed household precautions and preventive care measures, including: frequent hand-washing, wiping down of high touch areas ie: doorknobs, counter tops. Avoid touching your face, and isolate, distance from rest of household. Household members must quarantine as well. Reviewed symptoms which warrant an ED visit. Pt verbalizes understanding.  [facility] alerted in earlier encounter.

## 2019-08-03 ENCOUNTER — Telehealth: Payer: Self-pay | Admitting: Rheumatology

## 2019-08-03 DIAGNOSIS — M351 Other overlap syndromes: Secondary | ICD-10-CM

## 2019-08-03 MED ORDER — HYDROXYCHLOROQUINE SULFATE 200 MG PO TABS: 200.0000 mg | ORAL_TABLET | Freq: Every day | ORAL | 0 refills | Status: DC

## 2019-08-03 NOTE — Telephone Encounter (Signed)
Last Visit: 05/12/2019  Next Visit: 10/13/2019 Labs: 05/12/2019 stable  Eye exam: 07/15/2018  Attempted to contact patient and left message on machine to advise patient she is due to update eye exam.   Okay to refill per Dr. Estanislado Pandy.

## 2019-08-03 NOTE — Telephone Encounter (Signed)
Patient request a refill on generic Plaquenil sent to [facility] in [place].

## 2019-10-05 NOTE — Progress Notes (Deleted)
Office Visit Note  Patient: Jennifer Mullen             Date of Birth: 11/26/1945           MRN: [id number]             PCP: Renaye Rakers, MD Referring: Renaye Rakers, MD Visit Date: 10/13/2019 Occupation: @GUAROCC @  Subjective:  No chief complaint on file.   History of Present Illness: Jennifer Mullen is a 74 y.o. female ***   Activities of Daily Living:  Patient reports morning stiffness for *** {minute/hour:19697}.   Patient {ACTIONS;DENIES/REPORTS:21021675::"Denies"} nocturnal pain.  Difficulty dressing/grooming: {ACTIONS;DENIES/REPORTS:21021675::"Denies"} Difficulty climbing stairs: {ACTIONS;DENIES/REPORTS:21021675::"Denies"} Difficulty getting out of chair: {ACTIONS;DENIES/REPORTS:21021675::"Denies"} Difficulty using hands for taps, buttons, cutlery, and/or writing: {ACTIONS;DENIES/REPORTS:21021675::"Denies"}  No Rheumatology ROS completed.   PMFS History:  Patient Active Problem List   Diagnosis Date Noted  . Mixed connective tissue disease (HCC) 06/30/2018  . HYPOTHYROIDISM 05/21/2010  . ANOREXIA 12/21/2009  . THYROMEGALY 12/03/2009  . WEIGHT LOSS 12/03/2009    Past Medical History:  Diagnosis Date  . Hypercholesteremia   . Hypertension   . Syncope   . Thyroid disease     Family History  Problem Relation Age of Onset  . Leukemia Mother   . Heart Problems Father   . Diabetes Son    Past Surgical History:  Procedure Laterality Date  . ABDOMINAL HYSTERECTOMY    . APPENDECTOMY    . CHOLECYSTECTOMY    . TUBAL LIGATION     Social History   Social History Narrative  . Not on file   Immunization History  Administered Date(s) Administered  . Influenza, High Dose Seasonal PF 05/10/2018     Objective: Vital Signs: There were no vitals taken for this visit.   Physical Exam   Musculoskeletal Exam: ***  CDAI Exam: CDAI Score: -- Patient Global: --; Provider Global: -- Swollen: --; Tender: -- Joint Exam 10/13/2019   No joint exam has been  documented for this visit   There is currently no information documented on the homunculus. Go to the Rheumatology activity and complete the homunculus joint exam.  Investigation: No additional findings.  Imaging: No results found.  Recent Labs: Lab Results  Component Value Date   WBC 5.8 05/12/2019   HGB 13.0 05/12/2019   PLT 257 05/12/2019   NA 140 05/12/2019   K 4.6 05/12/2019   CL 104 05/12/2019   CO2 24 05/12/2019   GLUCOSE 81 05/12/2019   BUN 13 05/12/2019   CREATININE 0.95 (H) 05/12/2019   BILITOT 0.5 05/12/2019   ALKPHOS 114 05/12/2014   AST 18 05/12/2019   ALT 9 05/12/2019   PROT 6.9 05/12/2019   ALBUMIN 4.3 05/12/2014   CALCIUM 9.5 05/12/2019   GFRAA 69 05/12/2019    Speciality Comments: PLQ eye exam: 07/15/2018 normal. Follow up in 1 year.  Procedures:  No procedures performed Allergies: Patient has no known allergies.   Assessment / Plan:     Visit Diagnoses: No diagnosis found.  Orders: No orders of the defined types were placed in this encounter.  No orders of the defined types were placed in this encounter.   Face-to-face time spent with patient was *** minutes. Greater than 50% of time was spent in counseling and coordination of care.  Follow-Up Instructions: No follow-ups on file.   07/17/2018, CMA  Note - This record has been created using Ellen Henri.  Chart creation errors have been sought, but may not always  have  been located. Such creation errors do not reflect on  the standard of medical care.

## 2019-10-13 ENCOUNTER — Ambulatory Visit: Payer: Medicare PPO | Admitting: Physician Assistant

## 2019-10-27 DIAGNOSIS — I10 Essential (primary) hypertension: Secondary | ICD-10-CM | POA: Diagnosis not present

## 2019-10-27 DIAGNOSIS — E039 Hypothyroidism, unspecified: Secondary | ICD-10-CM | POA: Diagnosis not present

## 2019-10-27 DIAGNOSIS — G4762 Sleep related leg cramps: Secondary | ICD-10-CM | POA: Diagnosis not present

## 2019-10-27 DIAGNOSIS — R69 Illness, unspecified: Secondary | ICD-10-CM | POA: Diagnosis not present

## 2019-10-27 DIAGNOSIS — M150 Primary generalized (osteo)arthritis: Secondary | ICD-10-CM | POA: Diagnosis not present

## 2019-11-09 NOTE — Progress Notes (Signed)
Office Visit Note  Patient: Jennifer Mullen             Date of Birth: 01-20-1946           MRN: [id number]             PCP: Renaye Rakers, MD Referring: Renaye Rakers, MD Visit Date: 11/17/2019 Occupation: @GUAROCC @  Subjective:  Medication monitoring   History of Present Illness: Jennifer Mullen is a 74 y.o. female with history of mixed connective tissue disease, osteoarthritis, and osteoporosis.  She is taking Plaquenil 200 mg 1 tablet by mouth daily and Norvasc 2.5 mg 1 tablet daily.  She continues to have frequent symptoms of Raynaud's in her hands and feet.  She denies any ulcerations.  She has not had any recent rashes, photosensitivity, hair loss, oral or nasal ulcerations.  She denies any chest pain, shortness of breath, or palpitations recently.  She states that she saw Dr. 61 about 1 year ago but has not scheduled a follow-up visit.  She reports that she has intermittent tenderness, redness, and swelling in the left ankle joint.  She has chronic pain in both CMC joints.     Activities of Daily Living:  Patient reports morning stiffness for 15 minutes.   Patient Denies nocturnal pain.  Difficulty dressing/grooming: Denies Difficulty climbing stairs: Denies Difficulty getting out of chair: Denies Difficulty using hands for taps, buttons, cutlery, and/or writing: Reports  Review of Systems  Constitutional: Negative for fatigue.  HENT: Positive for mouth dryness. Negative for mouth sores and nose dryness.   Eyes: Negative for pain, itching, visual disturbance and dryness.  Respiratory: Negative for cough, hemoptysis, shortness of breath, wheezing and difficulty breathing.   Cardiovascular: Negative for chest pain, palpitations, hypertension and swelling in legs/feet.  Gastrointestinal: Negative for blood in stool, constipation and diarrhea.  Endocrine: Negative for increased urination.  Genitourinary: Negative for difficulty urinating and painful urination.    Musculoskeletal: Positive for arthralgias, joint pain, joint swelling and morning stiffness. Negative for myalgias, muscle weakness, muscle tenderness and myalgias.  Skin: Positive for color change. Negative for pallor, rash, hair loss, nodules/bumps, redness, skin tightness, ulcers and sensitivity to sunlight.  Allergic/Immunologic: Negative for susceptible to infections.  Neurological: Positive for numbness, headaches and memory loss. Negative for dizziness and weakness.  Hematological: Negative for swollen glands.  Psychiatric/Behavioral: Negative for depressed mood and sleep disturbance. The patient is not nervous/anxious.     PMFS History:  Patient Active Problem List   Diagnosis Date Noted  . Mixed connective tissue disease (HCC) 06/30/2018  . HYPOTHYROIDISM 05/21/2010  . ANOREXIA 12/21/2009  . THYROMEGALY 12/03/2009  . WEIGHT LOSS 12/03/2009    Past Medical History:  Diagnosis Date  . Hypercholesteremia   . Hypertension   . Syncope   . Thyroid disease     Family History  Problem Relation Age of Onset  . Leukemia Mother   . Heart Problems Father   . Diabetes Son    Past Surgical History:  Procedure Laterality Date  . ABDOMINAL HYSTERECTOMY    . APPENDECTOMY    . CHOLECYSTECTOMY    . TUBAL LIGATION     Social History   Social History Narrative  . Not on file   Immunization History  Administered Date(s) Administered  . Influenza, High Dose Seasonal PF 05/10/2018     Objective: Vital Signs: BP 116/72 (BP Location: Left Arm, Patient Position: Sitting, Cuff Size: Normal)   Pulse 91   Resp 13   Ht  5\' 3"  (1.6 m)   Wt 115 lb (52.2 kg)   BMI 20.37 kg/m    Physical Exam Vitals and nursing note reviewed.  Constitutional:      Appearance: She is well-developed.  HENT:     Head: Normocephalic and atraumatic.  Eyes:     Conjunctiva/sclera: Conjunctivae normal.  Pulmonary:     Effort: Pulmonary effort is normal.  Abdominal:     General: Bowel sounds are  normal.     Palpations: Abdomen is soft.  Musculoskeletal:     Cervical back: Normal range of motion.  Lymphadenopathy:     Cervical: No cervical adenopathy.  Skin:    General: Skin is warm and dry.     Capillary Refill: Capillary refill takes less than 2 seconds.  Neurological:     Mental Status: She is alert and oriented to person, place, and time.  Psychiatric:        Behavior: Behavior normal.      Musculoskeletal Exam: C-spine, thoracic spine, lumbar spine good range of motion.  No midline spinal tenderness.  No SI joint tenderness.  Shoulder joints, elbow joints, wrist joints, MCPs, PIPs and DIPs good range of motion no synovitis.  She has complete fist formation bilaterally.  Hip joints, knee joints, ankle joints, MTPs, PIPs and DIPs good range of motion no synovitis.  No warmth or effusion of bilateral knee joints.  No tenderness or swelling of ankle joints.  CDAI Exam: CDAI Score: -- Patient Global: --; Provider Global: -- Swollen: --; Tender: -- Joint Exam 11/17/2019   No joint exam has been documented for this visit   There is currently no information documented on the homunculus. Go to the Rheumatology activity and complete the homunculus joint exam.  Investigation: No additional findings.  Imaging: No results found.  Recent Labs: Lab Results  Component Value Date   WBC 5.8 05/12/2019   HGB 13.0 05/12/2019   PLT 257 05/12/2019   NA 140 05/12/2019   K 4.6 05/12/2019   CL 104 05/12/2019   CO2 24 05/12/2019   GLUCOSE 81 05/12/2019   BUN 13 05/12/2019   CREATININE 0.95 (H) 05/12/2019   BILITOT 0.5 05/12/2019   ALKPHOS 114 05/12/2014   AST 18 05/12/2019   ALT 9 05/12/2019   PROT 6.9 05/12/2019   ALBUMIN 4.3 05/12/2014   CALCIUM 9.5 05/12/2019   GFRAA 69 05/12/2019    Speciality Comments: PLQ eye exam: 07/15/2018 normal. Follow up in 1 year.  Procedures:  No procedures performed Allergies: Patient has no known allergies.   Assessment / Plan:      Visit Diagnoses: Mixed connective tissue disease (HCC) - History of fatigue, sicca symptoms, malar rash, Raynaud's phenomenon, positive ANA, positive RNP: She has not had any signs or symptoms of a flare recently.  She is clinically doing well on Plaquenil 200 mg 1 tablet by mouth daily.  She has been having frequent symptoms of Raynaud's despite taking Norvasc 2.5 mg daily.  No digital ulcerations or signs of gangrene were noted.  She has good capillary refill on exam today.  No capillary bed changes were noted.We discussed the importance of keeping her core body temperature warm, wearing gloves/socks, and drinking warm fluids.  She will be unable to increase the dose of Norvasc due to her blood pressure running on the lower side of normal.  She had an echo and PFTs were performed on 10/27/2018 that did not reveal any evidence of PAH.  Dr. 12/27/2018 had recommended repeat screening  studies in 1 year.  She was advised to call Dr. Clayborne Dana office to schedule an appointment.  She has not had any recent rashes, photosensitivity, hair loss, oral or nasal ulcerations, chest pain, or shortness of breath.  She will continue taking Plaquenil as prescribed.  She was advised to notify us if she develops any new or worsening symptoms.  We will check autoimmune lab work today.  She will follow-up in the office in 5 months.- Plan: CBC with Differential/Platelet, Urinalysis, Routine w reflex microscopic, COMPLETE METABOLIC PANEL WITH GFR, Anti-DNA antibody, double-stranded, Sedimentation rate, C3 and C4, ANA, RNP Antibody  High risk medication use - Plaquenil 200 mg 1 tablet daily. eye exam: 07/15/2018.  She has had a recent Plaquenil eye exam and we will try to obtain records from her ophthalmologist.  CBC and CMP will be drawn today to monitor for drug toxicity.- Plan: CBC with Differential/Platelet, COMPLETE METABOLIC PANEL WITH GFR  Primary osteoarthritis of both hands: She has no  inflammation on exam.  She has  complete fist formation bilaterally.  She has tenderness of bilateral CMC joints.  We discussed using CMC joint braces for support.  Joint protection and muscle strengthening were discussed.  Raynaud's disease without gangrene - Norvasc 2.5 mg p.o. daily   Age-related osteoporosis without current pathological fracture - She is not currently on therapy.  Last DEXA on 08/28/2015 ordered by Dr. Criss Rosales showed T score of -2.8 at AP spine with 7.6% improvement in BMD.  Pain and swelling of left ankle - She experiences intermittent left ankle joint tenderness and inflammation.she states during these episodes the left ankle will be red and swollen and last only 1 to 2 days.  She states during these times she is able to bear full weight and wear socks and shoes without discomfort.  She is concerned about the diagnosis of gout.  We will check a uric acid level today.  Plan: Uric acid  Other medical conditions are listed as follows:   Poor dentition  History of hypothyroidism  Weight loss  Dyslipidemia    Orders: Orders Placed This Encounter  Procedures  . CBC with Differential/Platelet  . Urinalysis, Routine w reflex microscopic  . COMPLETE METABOLIC PANEL WITH GFR  . Anti-DNA antibody, double-stranded  . Sedimentation rate  . C3 and C4  . ANA  . RNP Antibody  . Uric acid   No orders of the defined types were placed in this encounter.   Face-to-face time spent with patient was 30 minutes. Greater than 50% of time was spent in counseling and coordination of care.  Follow-Up Instructions: Return in about 5 months (around 04/18/2020) for Mixed connective tissue disease.   Ofilia Neas, PA-C  Note - This record has been created using Dragon software.  Chart creation errors have been sought, but may not always  have been located. Such creation errors do not reflect on  the standard of medical care.

## 2019-11-17 ENCOUNTER — Encounter: Payer: Self-pay | Admitting: Physician Assistant

## 2019-11-17 ENCOUNTER — Other Ambulatory Visit: Payer: Self-pay

## 2019-11-17 ENCOUNTER — Ambulatory Visit: Payer: Medicare HMO | Admitting: Physician Assistant

## 2019-11-17 VITALS — BP 116/72 | HR 91 | Resp 13 | Ht 63.0 in | Wt 115.0 lb

## 2019-11-17 DIAGNOSIS — E785 Hyperlipidemia, unspecified: Secondary | ICD-10-CM

## 2019-11-17 DIAGNOSIS — K089 Disorder of teeth and supporting structures, unspecified: Secondary | ICD-10-CM

## 2019-11-17 DIAGNOSIS — M25472 Effusion, left ankle: Secondary | ICD-10-CM

## 2019-11-17 DIAGNOSIS — M351 Other overlap syndromes: Secondary | ICD-10-CM | POA: Diagnosis not present

## 2019-11-17 DIAGNOSIS — Z8639 Personal history of other endocrine, nutritional and metabolic disease: Secondary | ICD-10-CM | POA: Diagnosis not present

## 2019-11-17 DIAGNOSIS — M25572 Pain in left ankle and joints of left foot: Secondary | ICD-10-CM

## 2019-11-17 DIAGNOSIS — M19041 Primary osteoarthritis, right hand: Secondary | ICD-10-CM | POA: Diagnosis not present

## 2019-11-17 DIAGNOSIS — M810 Age-related osteoporosis without current pathological fracture: Secondary | ICD-10-CM

## 2019-11-17 DIAGNOSIS — M19042 Primary osteoarthritis, left hand: Secondary | ICD-10-CM

## 2019-11-17 DIAGNOSIS — I7300 Raynaud's syndrome without gangrene: Secondary | ICD-10-CM | POA: Diagnosis not present

## 2019-11-17 DIAGNOSIS — Z79899 Other long term (current) drug therapy: Secondary | ICD-10-CM

## 2019-11-17 DIAGNOSIS — R634 Abnormal weight loss: Secondary | ICD-10-CM | POA: Diagnosis not present

## 2019-11-20 ENCOUNTER — Telehealth: Payer: Self-pay | Admitting: *Deleted

## 2019-11-20 DIAGNOSIS — M351 Other overlap syndromes: Secondary | ICD-10-CM

## 2019-11-20 LAB — URINALYSIS, ROUTINE W REFLEX MICROSCOPIC
Bacteria, UA: NONE SEEN /HPF
Bilirubin Urine: NEGATIVE
Glucose, UA: NEGATIVE
Hgb urine dipstick: NEGATIVE
Hyaline Cast: NONE SEEN /LPF
Ketones, ur: NEGATIVE
Nitrite: NEGATIVE
Protein, ur: NEGATIVE
RBC / HPF: NONE SEEN /HPF (ref 0–2)
Specific Gravity, Urine: 1.008 (ref 1.001–1.03)
Squamous Epithelial / HPF: NONE SEEN /HPF (ref <=5)
WBC, UA: NONE SEEN /HPF (ref 0–5)
pH: 6.5 (ref 5.0–8.0)

## 2019-11-20 LAB — COMPLETE METABOLIC PANEL WITH GFR
AG Ratio: 2.3 (calc) (ref 1.0–2.5)
ALT: 9 U/L (ref 6–29)
AST: 15 U/L (ref 10–35)
Albumin: 4.8 g/dL (ref 3.6–5.1)
Alkaline phosphatase (APISO): 91 U/L (ref 37–153)
BUN: 14 mg/dL (ref 7–25)
CO2: 28 mmol/L (ref 20–32)
Calcium: 9.6 mg/dL (ref 8.6–10.4)
Chloride: 105 mmol/L (ref 98–110)
Creat: 0.91 mg/dL (ref 0.60–0.93)
GFR, Est African American: 73 mL/min/{1.73_m2} (ref >=60)
GFR, Est Non African American: 63 mL/min/{1.73_m2} (ref >=60)
Globulin: 2.1 g/dL (calc) (ref 1.9–3.7)
Glucose, Bld: 87 mg/dL (ref 65–99)
Potassium: 4.5 mmol/L (ref 3.5–5.3)
Sodium: 141 mmol/L (ref 135–146)
Total Bilirubin: 0.6 mg/dL (ref 0.2–1.2)
Total Protein: 6.9 g/dL (ref 6.1–8.1)

## 2019-11-20 LAB — CBC WITH DIFFERENTIAL/PLATELET
Absolute Monocytes: 509 cells/uL (ref 200–950)
Basophils Absolute: 48 cells/uL (ref 0–200)
Basophils Relative: 0.9 %
Eosinophils Absolute: 148 cells/uL (ref 15–500)
Eosinophils Relative: 2.8 %
HCT: 40.7 % (ref 35.0–45.0)
Hemoglobin: 13 g/dL (ref 11.7–15.5)
Lymphs Abs: 986 cells/uL (ref 850–3900)
MCH: 27.5 pg (ref 27.0–33.0)
MCHC: 31.9 g/dL — ABNORMAL LOW (ref 32.0–36.0)
MCV: 86.2 fL (ref 80.0–100.0)
MPV: 12 fL (ref 7.5–12.5)
Monocytes Relative: 9.6 %
Neutro Abs: 3609 cells/uL (ref 1500–7800)
Neutrophils Relative %: 68.1 %
Platelets: 244 10*3/uL (ref 140–400)
RBC: 4.72 10*6/uL (ref 3.80–5.10)
RDW: 12.9 % (ref 11.0–15.0)
Total Lymphocyte: 18.6 %
WBC: 5.3 10*3/uL (ref 3.8–10.8)

## 2019-11-20 LAB — URIC ACID: Uric Acid, Serum: 4.4 mg/dL (ref 2.5–7.0)

## 2019-11-20 LAB — RNP ANTIBODY: Ribonucleic Protein(ENA) Antibody, IgG: 1.4 AI — AB

## 2019-11-20 LAB — ANTI-NUCLEAR AB-TITER (ANA TITER)
ANA TITER: 1:1280 {titer} — ABNORMAL HIGH
ANA Titer 1: 1:160 {titer} — ABNORMAL HIGH

## 2019-11-20 LAB — C3 AND C4
C3 Complement: 121 mg/dL (ref 83–193)
C4 Complement: 29 mg/dL (ref 15–57)

## 2019-11-20 LAB — ANTI-DNA ANTIBODY, DOUBLE-STRANDED: ds DNA Ab: <1 IU/mL

## 2019-11-20 LAB — SEDIMENTATION RATE: Sed Rate: 6 mm/h (ref 0–30)

## 2019-11-20 LAB — ANA: Anti Nuclear Antibody (ANA): POSITIVE — AB

## 2019-11-20 NOTE — Progress Notes (Signed)
CBC and CMP WNL.  UA revealed trace leukocytes. Negative for bacteria and nitrites. If she develops symptoms of a UTI please advise patient to follow up with PCP. ANA and RNP remain positive-titers stable. DsDNA negative, complements WNL, ESR WNL.  Uric acid WNL.

## 2019-11-20 NOTE — Telephone Encounter (Signed)
Patient advised of lab results. Patient states she was advised to take Aspirin at her office visit on 11/17/19. Please advise.   Patient is requesting a refill on PLQ  Last Visit: 11/17/19 Next Visit: 04/18/20 Labs: 11/17/19 CBC and CMP WNL Eye exam: 07/15/2018 normal.   Patient to contact eye doctor and have eye exam results sent to our office.   Okay to refill Plaquenil?

## 2019-11-21 MED ORDER — HYDROXYCHLOROQUINE SULFATE 200 MG PO TABS: 200.0000 mg | ORAL_TABLET | Freq: Every day | ORAL | 0 refills | Status: DC

## 2019-11-21 NOTE — Telephone Encounter (Signed)
Ok to refill PLQ.  Dr. Corliss Skains states she can try taking aspirin 81 mg 3 times a week to see if it will help with her symptoms of Raynaud's.  Please advise patient to reach out to her cardiologist for their opinion on the use of aspirin as well.

## 2019-11-21 NOTE — Telephone Encounter (Signed)
Ok to refill PLQ.  Patient advised Dr. Corliss Skains states she can try taking aspirin 81 mg 3 times a week to see if it will help with her symptoms of Raynaud's.  Patient advised to reach out to her cardiologist for their opinion on the use of aspirin as well.

## 2019-12-18 DIAGNOSIS — E039 Hypothyroidism, unspecified: Secondary | ICD-10-CM | POA: Diagnosis not present

## 2019-12-18 DIAGNOSIS — K2100 Gastro-esophageal reflux disease with esophagitis, without bleeding: Secondary | ICD-10-CM | POA: Diagnosis not present

## 2019-12-18 DIAGNOSIS — K209 Esophagitis, unspecified without bleeding: Secondary | ICD-10-CM | POA: Diagnosis not present

## 2019-12-18 DIAGNOSIS — I10 Essential (primary) hypertension: Secondary | ICD-10-CM | POA: Diagnosis not present

## 2019-12-18 DIAGNOSIS — R69 Illness, unspecified: Secondary | ICD-10-CM | POA: Diagnosis not present

## 2019-12-20 ENCOUNTER — Other Ambulatory Visit: Payer: Self-pay | Admitting: Family Medicine

## 2019-12-20 ENCOUNTER — Other Ambulatory Visit (HOSPITAL_COMMUNITY): Payer: Self-pay | Admitting: Family Medicine

## 2019-12-20 DIAGNOSIS — K219 Gastro-esophageal reflux disease without esophagitis: Secondary | ICD-10-CM

## 2019-12-20 DIAGNOSIS — R1310 Dysphagia, unspecified: Secondary | ICD-10-CM

## 2019-12-25 ENCOUNTER — Ambulatory Visit (HOSPITAL_COMMUNITY)
Admission: RE | Admit: 2019-12-25 | Discharge: 2019-12-25 | Disposition: A | Discharge status: Home / Self Care | Payer: Medicare HMO | Source: Ambulatory Visit | Attending: Family Medicine | Admitting: Family Medicine

## 2019-12-25 ENCOUNTER — Other Ambulatory Visit: Payer: Self-pay

## 2019-12-25 DIAGNOSIS — R1310 Dysphagia, unspecified: Secondary | ICD-10-CM | POA: Insufficient documentation

## 2019-12-25 DIAGNOSIS — K219 Gastro-esophageal reflux disease without esophagitis: Secondary | ICD-10-CM | POA: Diagnosis not present

## 2020-01-05 DIAGNOSIS — R69 Illness, unspecified: Secondary | ICD-10-CM | POA: Diagnosis not present

## 2020-01-05 DIAGNOSIS — K219 Gastro-esophageal reflux disease without esophagitis: Secondary | ICD-10-CM | POA: Diagnosis not present

## 2020-01-05 DIAGNOSIS — E039 Hypothyroidism, unspecified: Secondary | ICD-10-CM | POA: Diagnosis not present

## 2020-02-05 ENCOUNTER — Other Ambulatory Visit: Payer: Self-pay | Admitting: Rheumatology

## 2020-02-05 DIAGNOSIS — M351 Other overlap syndromes: Secondary | ICD-10-CM

## 2020-02-05 MED ORDER — HYDROXYCHLOROQUINE SULFATE 200 MG PO TABS: 200.0000 mg | ORAL_TABLET | Freq: Every day | ORAL | 0 refills | Status: DC

## 2020-02-05 NOTE — Telephone Encounter (Signed)
Has she had a recent eye exam and we are awaiting results or does she need to schedule an updated eye exam?

## 2020-02-05 NOTE — Telephone Encounter (Signed)
Ok to refill 30-day supply of PLQ until we have received the results.

## 2020-02-05 NOTE — Telephone Encounter (Addendum)
Last Visit: 11/17/2019 Next Visit: due august 2021. Message sent to the front to schedule appointment.  Labs: 11/17/2019 CBC and CMP WNL Eye exam: 07/15/2018 WNL  Current Dose per office note 11/17/2019: Plaquenil 200 mg 1 tablet daily  Patient advised we need her updated PLQ eye exam. Patient will contact her eye doctor and have them send the results.   Okay to refill 30 day supply PLQ?

## 2020-02-05 NOTE — Telephone Encounter (Signed)
Patient stated she had a recent Eye exam al [facility] and we are awaiting results.

## 2020-02-05 NOTE — Telephone Encounter (Signed)
Patient request a refill on Plaquenil sent to [facility] in [place].

## 2020-02-07 ENCOUNTER — Telehealth: Payer: Self-pay | Admitting: *Deleted

## 2020-02-07 NOTE — Telephone Encounter (Signed)
We received results from an eye exam that was performed at [facility]. Faxed our PLQ eye exam form on 3 separate occassions for them to fill out and fax back with requested information. We have not received the returned form to our office. Patient advised on 02/05/2020 that we have not received this information. She will be reaching out to the eye doctor. Will send document to scan center.

## 2020-03-01 DIAGNOSIS — I10 Essential (primary) hypertension: Secondary | ICD-10-CM | POA: Diagnosis not present

## 2020-03-01 DIAGNOSIS — E0500 Thyrotoxicosis with diffuse goiter without thyrotoxic crisis or storm: Secondary | ICD-10-CM | POA: Diagnosis not present

## 2020-03-01 DIAGNOSIS — M150 Primary generalized (osteo)arthritis: Secondary | ICD-10-CM | POA: Diagnosis not present

## 2020-03-01 DIAGNOSIS — Z6820 Body mass index (BMI) 20.0-20.9, adult: Secondary | ICD-10-CM | POA: Diagnosis not present

## 2020-03-01 DIAGNOSIS — M130 Polyarthritis, unspecified: Secondary | ICD-10-CM | POA: Diagnosis not present

## 2020-05-23 DIAGNOSIS — R69 Illness, unspecified: Secondary | ICD-10-CM | POA: Diagnosis not present

## 2020-05-23 DIAGNOSIS — I10 Essential (primary) hypertension: Secondary | ICD-10-CM | POA: Diagnosis not present

## 2020-05-23 DIAGNOSIS — E039 Hypothyroidism, unspecified: Secondary | ICD-10-CM | POA: Diagnosis not present

## 2020-05-23 DIAGNOSIS — E7849 Other hyperlipidemia: Secondary | ICD-10-CM | POA: Diagnosis not present

## 2020-07-05 ENCOUNTER — Other Ambulatory Visit (HOSPITAL_COMMUNITY): Payer: Self-pay | Admitting: Family Medicine

## 2020-07-05 DIAGNOSIS — R69 Illness, unspecified: Secondary | ICD-10-CM | POA: Diagnosis not present

## 2020-07-05 DIAGNOSIS — M150 Primary generalized (osteo)arthritis: Secondary | ICD-10-CM | POA: Diagnosis not present

## 2020-07-05 DIAGNOSIS — I10 Essential (primary) hypertension: Secondary | ICD-10-CM | POA: Diagnosis not present

## 2020-07-05 DIAGNOSIS — Z1231 Encounter for screening mammogram for malignant neoplasm of breast: Secondary | ICD-10-CM

## 2020-07-05 DIAGNOSIS — E0500 Thyrotoxicosis with diffuse goiter without thyrotoxic crisis or storm: Secondary | ICD-10-CM | POA: Diagnosis not present

## 2020-07-29 DIAGNOSIS — R69 Illness, unspecified: Secondary | ICD-10-CM | POA: Diagnosis not present

## 2020-07-29 DIAGNOSIS — M150 Primary generalized (osteo)arthritis: Secondary | ICD-10-CM | POA: Diagnosis not present

## 2020-07-29 DIAGNOSIS — I10 Essential (primary) hypertension: Secondary | ICD-10-CM | POA: Diagnosis not present

## 2020-07-29 DIAGNOSIS — Z0000 Encounter for general adult medical examination without abnormal findings: Secondary | ICD-10-CM | POA: Diagnosis not present

## 2020-07-31 ENCOUNTER — Other Ambulatory Visit: Payer: Self-pay

## 2020-07-31 ENCOUNTER — Ambulatory Visit (HOSPITAL_COMMUNITY)
Admission: RE | Admit: 2020-07-31 | Discharge: 2020-07-31 | Disposition: A | Discharge status: Home / Self Care | Payer: Medicare HMO | Source: Ambulatory Visit | Attending: Family Medicine | Admitting: Family Medicine

## 2020-07-31 DIAGNOSIS — Z1231 Encounter for screening mammogram for malignant neoplasm of breast: Secondary | ICD-10-CM | POA: Diagnosis not present

## 2020-08-23 DIAGNOSIS — E039 Hypothyroidism, unspecified: Secondary | ICD-10-CM | POA: Diagnosis not present

## 2020-08-23 DIAGNOSIS — E7849 Other hyperlipidemia: Secondary | ICD-10-CM | POA: Diagnosis not present

## 2020-08-23 DIAGNOSIS — I10 Essential (primary) hypertension: Secondary | ICD-10-CM | POA: Diagnosis not present

## 2020-11-08 DIAGNOSIS — H8309 Labyrinthitis, unspecified ear: Secondary | ICD-10-CM | POA: Diagnosis not present

## 2020-11-08 DIAGNOSIS — E0500 Thyrotoxicosis with diffuse goiter without thyrotoxic crisis or storm: Secondary | ICD-10-CM | POA: Diagnosis not present

## 2020-11-29 DIAGNOSIS — I10 Essential (primary) hypertension: Secondary | ICD-10-CM | POA: Diagnosis not present

## 2020-11-29 DIAGNOSIS — E039 Hypothyroidism, unspecified: Secondary | ICD-10-CM | POA: Diagnosis not present

## 2020-11-29 DIAGNOSIS — H8319 Labyrinthine fistula, unspecified ear: Secondary | ICD-10-CM | POA: Diagnosis not present

## 2020-12-21 DIAGNOSIS — E039 Hypothyroidism, unspecified: Secondary | ICD-10-CM | POA: Diagnosis not present

## 2020-12-21 DIAGNOSIS — I10 Essential (primary) hypertension: Secondary | ICD-10-CM | POA: Diagnosis not present

## 2020-12-21 DIAGNOSIS — E7849 Other hyperlipidemia: Secondary | ICD-10-CM | POA: Diagnosis not present

## 2021-03-23 DIAGNOSIS — I10 Essential (primary) hypertension: Secondary | ICD-10-CM | POA: Diagnosis not present

## 2021-03-23 DIAGNOSIS — E039 Hypothyroidism, unspecified: Secondary | ICD-10-CM | POA: Diagnosis not present

## 2021-03-23 DIAGNOSIS — E7849 Other hyperlipidemia: Secondary | ICD-10-CM | POA: Diagnosis not present

## 2021-04-04 DIAGNOSIS — K027 Dental root caries: Secondary | ICD-10-CM | POA: Diagnosis not present

## 2021-04-04 DIAGNOSIS — I498 Other specified cardiac arrhythmias: Secondary | ICD-10-CM | POA: Diagnosis not present

## 2021-04-04 DIAGNOSIS — I10 Essential (primary) hypertension: Secondary | ICD-10-CM | POA: Diagnosis not present

## 2021-04-04 DIAGNOSIS — F4322 Adjustment disorder with anxiety: Secondary | ICD-10-CM | POA: Diagnosis not present

## 2021-04-04 DIAGNOSIS — R634 Abnormal weight loss: Secondary | ICD-10-CM | POA: Diagnosis not present

## 2021-04-08 ENCOUNTER — Telehealth: Payer: Self-pay | Admitting: *Deleted

## 2021-04-08 DIAGNOSIS — I498 Other specified cardiac arrhythmias: Secondary | ICD-10-CM

## 2021-04-08 DIAGNOSIS — H25813 Combined forms of age-related cataract, bilateral: Secondary | ICD-10-CM | POA: Diagnosis not present

## 2021-04-08 NOTE — Telephone Encounter (Signed)
Faxed order from Dr. Parke Simmers requesting the pt wear 30 day event monitor for Dx: I49.8. Order placed and pt enrolled in [employer].

## 2021-04-11 ENCOUNTER — Ambulatory Visit (INDEPENDENT_AMBULATORY_CARE_PROVIDER_SITE_OTHER): Payer: Medicare HMO

## 2021-04-11 DIAGNOSIS — I498 Other specified cardiac arrhythmias: Secondary | ICD-10-CM | POA: Diagnosis not present

## 2021-04-15 ENCOUNTER — Other Ambulatory Visit: Payer: Self-pay

## 2021-05-09 DIAGNOSIS — E039 Hypothyroidism, unspecified: Secondary | ICD-10-CM | POA: Diagnosis not present

## 2021-05-09 DIAGNOSIS — I10 Essential (primary) hypertension: Secondary | ICD-10-CM | POA: Diagnosis not present

## 2021-05-09 DIAGNOSIS — I498 Other specified cardiac arrhythmias: Secondary | ICD-10-CM | POA: Diagnosis not present

## 2021-05-23 DIAGNOSIS — E7849 Other hyperlipidemia: Secondary | ICD-10-CM | POA: Diagnosis not present

## 2021-05-23 DIAGNOSIS — E039 Hypothyroidism, unspecified: Secondary | ICD-10-CM | POA: Diagnosis not present

## 2021-05-23 DIAGNOSIS — I10 Essential (primary) hypertension: Secondary | ICD-10-CM | POA: Diagnosis not present

## 2021-06-23 DIAGNOSIS — H02834 Dermatochalasis of left upper eyelid: Secondary | ICD-10-CM | POA: Diagnosis not present

## 2021-06-23 DIAGNOSIS — H02831 Dermatochalasis of right upper eyelid: Secondary | ICD-10-CM | POA: Diagnosis not present

## 2021-06-23 DIAGNOSIS — E039 Hypothyroidism, unspecified: Secondary | ICD-10-CM | POA: Diagnosis not present

## 2021-06-23 DIAGNOSIS — H01002 Unspecified blepharitis right lower eyelid: Secondary | ICD-10-CM | POA: Diagnosis not present

## 2021-06-23 DIAGNOSIS — E7849 Other hyperlipidemia: Secondary | ICD-10-CM | POA: Diagnosis not present

## 2021-06-23 DIAGNOSIS — H2513 Age-related nuclear cataract, bilateral: Secondary | ICD-10-CM | POA: Diagnosis not present

## 2021-06-23 DIAGNOSIS — H01001 Unspecified blepharitis right upper eyelid: Secondary | ICD-10-CM | POA: Diagnosis not present

## 2021-06-23 DIAGNOSIS — I10 Essential (primary) hypertension: Secondary | ICD-10-CM | POA: Diagnosis not present

## 2021-07-07 DIAGNOSIS — I10 Essential (primary) hypertension: Secondary | ICD-10-CM | POA: Diagnosis not present

## 2021-07-07 DIAGNOSIS — E039 Hypothyroidism, unspecified: Secondary | ICD-10-CM | POA: Diagnosis not present

## 2021-07-11 DIAGNOSIS — E039 Hypothyroidism, unspecified: Secondary | ICD-10-CM | POA: Diagnosis not present

## 2021-07-11 DIAGNOSIS — F4322 Adjustment disorder with anxiety: Secondary | ICD-10-CM | POA: Diagnosis not present

## 2021-07-11 DIAGNOSIS — I10 Essential (primary) hypertension: Secondary | ICD-10-CM | POA: Diagnosis not present

## 2021-07-25 ENCOUNTER — Encounter (HOSPITAL_COMMUNITY): Payer: Self-pay

## 2021-07-25 ENCOUNTER — Encounter (HOSPITAL_COMMUNITY)
Admission: RE | Admit: 2021-07-25 | Discharge: 2021-07-25 | Disposition: A | Discharge status: Home / Self Care | Payer: Medicare HMO | Source: Ambulatory Visit | Attending: Ophthalmology | Admitting: Ophthalmology

## 2021-07-25 ENCOUNTER — Other Ambulatory Visit: Payer: Self-pay

## 2021-07-25 HISTORY — DX: Gout, unspecified: M10.9

## 2021-07-25 HISTORY — DX: Other disorders of electrolyte and fluid balance, not elsewhere classified: E87.8

## 2021-07-25 HISTORY — DX: Hypothyroidism, unspecified: E03.9

## 2021-07-30 NOTE — H&P (Signed)
Surgical History & Physical  Patient Name: Jennifer Mullen DOB: [birth year]  Surgery: Cataract extraction with intraocular lens implant phacoemulsification; Right Eye  Surgeon: [name] MD Surgery Date:  08-04-21 Pre-Op Date:  07-23-21  HPI: A 83 Yr. old female patient pt presents for cataract evaluation. pt was seen by Dr. Charise Killian 3 months ago and was told that cataracts have progressed OU and is now ready for surgery. pt states that she mainly has difficulty with reading small, fine print (especially in dim lighting). pt has difficulty with glare from bright lights and headlights as well. This is negatively affecting the patient's quality of life. pt states that eyes are often itchy and had been prescribed an eyedrop but cannot recall the name. pt is currently using once a day OU. HPI Completed by Dr. [name]  Medical History: Cataracts believes he may have glaucoma. unsure. Arthritis Thyroid Problems  Review of Systems Negative Allergic/Immunologic Negative Cardiovascular Negative Constitutional Negative Ear, Nose, Mouth & Throat Negative Endocrine Negative Eyes Negative Gastrointestinal Negative Genitourinary Negative Hemotologic/Lymphatic Negative Integumentary Negative Musculoskeletal Negative Neurological Negative Psychiatry Negative Respiratory  Social   Never smoked    Alcohol Never  Medication ROSUVASTATIN, SYNTHROID,   Sx/Procedures Hysterectomy, Appendectomy, Gall bladder removal,   Drug Allergies   NKDA  History & Physical: Heent: Cataract, Right Eye NECK: supple without bruits LUNGS: lungs clear to auscultation CV: regular rate and rhythm Abdomen: soft and non-tender Impression & Plan: Assessment: 1.  NUCLEAR SCLEROSIS AGE RELATED; Both Eyes (H25.13) 2.  DERMATOCHALASIS, no surgery; Right Upper Lid, Left Upper Lid (H02.831, H02.834) 3.  BLEPHARITIS; Right Upper Lid, Right Lower Lid (H01.001, H01.002)  Plan: 1.  Cataract accounts for the  patient's decreased vision. This visual impairment is not correctable with a tolerable change in glasses or contact lenses. Cataract surgery with an implantation of a new lens should significantly improve the visual and functional status of the patient. Discussed all risks, benefits, alternatives, and potential complications. Discussed the procedures and recovery. Patient desires to have surgery. A-scan ordered and performed today for intra-ocular lens calculations. The surgery will be performed in order to improve vision for driving, reading, and for eye examinations. Recommend phacoemulsification with intra-ocular lens. Recommend Dextenza for post-operative pain and inflammation. Right Eye. Dilates poorly - shugarcaine by protocol. Omidira.  2.  Asymptomatic, recommend observation for now. Findings, prognosis and treatment options reviewed.  3.  recommend regular lid cleaning.

## 2021-07-31 DIAGNOSIS — H2511 Age-related nuclear cataract, right eye: Secondary | ICD-10-CM | POA: Diagnosis not present

## 2021-08-04 ENCOUNTER — Ambulatory Visit (HOSPITAL_COMMUNITY)
Admission: RE | Admit: 2021-08-04 | Discharge: 2021-08-04 | Disposition: A | Discharge status: Home / Self Care | Payer: Medicare HMO | Attending: Ophthalmology | Admitting: Ophthalmology

## 2021-08-04 ENCOUNTER — Ambulatory Visit (HOSPITAL_COMMUNITY): Payer: Medicare HMO | Admitting: Anesthesiology

## 2021-08-04 ENCOUNTER — Encounter (HOSPITAL_COMMUNITY)
Admission: RE | Disposition: A | Discharge status: Home / Self Care | Payer: Self-pay | Source: Home / Self Care | Attending: Ophthalmology

## 2021-08-04 DIAGNOSIS — Z79899 Other long term (current) drug therapy: Secondary | ICD-10-CM | POA: Diagnosis not present

## 2021-08-04 DIAGNOSIS — E039 Hypothyroidism, unspecified: Secondary | ICD-10-CM | POA: Diagnosis not present

## 2021-08-04 DIAGNOSIS — H2511 Age-related nuclear cataract, right eye: Secondary | ICD-10-CM | POA: Insufficient documentation

## 2021-08-04 DIAGNOSIS — I10 Essential (primary) hypertension: Secondary | ICD-10-CM | POA: Insufficient documentation

## 2021-08-04 HISTORY — PX: CATARACT EXTRACTION W/PHACO: SHX586

## 2021-08-04 SURGERY — PHACOEMULSIFICATION, CATARACT, WITH IOL INSERTION
Anesthesia: Monitor Anesthesia Care | Site: Eye | Laterality: Right

## 2021-08-04 MED ORDER — STERILE WATER FOR IRRIGATION IR SOLN
Status: DC | PRN
  Administered 2021-08-04: 250 mL

## 2021-08-04 MED ORDER — LIDOCAINE HCL 3.5 % OP GEL: 1.0000 "application " | Freq: Once | OPHTHALMIC | Status: DC

## 2021-08-04 MED ORDER — PHENYLEPHRINE-KETOROLAC 1-0.3 % IO SOLN
INTRAOCULAR | Status: AC
  Filled 2021-08-04: qty 4

## 2021-08-04 MED ORDER — SODIUM HYALURONATE 23MG/ML IO SOSY
PREFILLED_SYRINGE | INTRAOCULAR | Status: DC | PRN
  Administered 2021-08-04: 0.6 mL via INTRAOCULAR

## 2021-08-04 MED ORDER — BSS IO SOLN
INTRAOCULAR | Status: DC | PRN
  Administered 2021-08-04: 15 mL via INTRAOCULAR

## 2021-08-04 MED ORDER — PHENYLEPHRINE-KETOROLAC 1-0.3 % IO SOLN
INTRAOCULAR | Status: DC | PRN
  Administered 2021-08-04: 500 mL via OPHTHALMIC

## 2021-08-04 MED ORDER — SODIUM HYALURONATE 10 MG/ML IO SOLUTION
PREFILLED_SYRINGE | INTRAOCULAR | Status: DC | PRN
  Administered 2021-08-04: 0.85 mL via INTRAOCULAR

## 2021-08-04 MED ORDER — PHENYLEPHRINE HCL 2.5 % OP SOLN
1.0000 [drp] | OPHTHALMIC | Status: AC | PRN
  Administered 2021-08-04 (×3): 1 [drp] via OPHTHALMIC

## 2021-08-04 MED ORDER — POVIDONE-IODINE 5 % OP SOLN
OPHTHALMIC | Status: DC | PRN
  Administered 2021-08-04: 1 via OPHTHALMIC

## 2021-08-04 MED ORDER — MIDAZOLAM HCL 2 MG/2ML IJ SOLN
INTRAMUSCULAR | Status: DC | PRN
Start: 2021-08-04 — End: 2021-08-04
  Administered 2021-08-04: 1 mg via INTRAVENOUS

## 2021-08-04 MED ORDER — TETRACAINE HCL 0.5 % OP SOLN
1.0000 [drp] | OPHTHALMIC | Status: AC | PRN
  Administered 2021-08-04 (×3): 1 [drp] via OPHTHALMIC

## 2021-08-04 MED ORDER — LIDOCAINE HCL (PF) 1 % IJ SOLN
INTRAOCULAR | Status: DC | PRN
  Administered 2021-08-04: 1 mL via OPHTHALMIC

## 2021-08-04 MED ORDER — TROPICAMIDE 1 % OP SOLN
1.0000 [drp] | OPHTHALMIC | Status: AC | PRN
  Administered 2021-08-04 (×3): 1 [drp] via OPHTHALMIC
  Filled 2021-08-04: qty 2

## 2021-08-04 MED ORDER — MIDAZOLAM HCL 2 MG/2ML IJ SOLN
INTRAMUSCULAR | Status: AC
  Filled 2021-08-04: qty 2

## 2021-08-04 MED ORDER — EPINEPHRINE PF 1 MG/ML IJ SOLN
INTRAMUSCULAR | Status: AC
  Filled 2021-08-04: qty 1

## 2021-08-04 MED ORDER — SODIUM CHLORIDE 0.9% FLUSH
INTRAVENOUS | Status: DC | PRN
Start: 2021-08-04 — End: 2021-08-04
  Administered 2021-08-04: 3 mL via INTRAVENOUS

## 2021-08-04 MED ORDER — NEOMYCIN-POLYMYXIN-DEXAMETH 3.5-10000-0.1 OP SUSP
OPHTHALMIC | Status: DC | PRN
  Administered 2021-08-04: 1 [drp] via OPHTHALMIC

## 2021-08-04 SURGICAL SUPPLY — 13 items
CATARACT SUITE SIGHTPATH (MISCELLANEOUS) ×1 IMPLANT
CLOTH BEACON ORANGE TIMEOUT ST (SAFETY) ×1 IMPLANT
EYE SHIELD UNIVERSAL CLEAR (GAUZE/BANDAGES/DRESSINGS) ×1 IMPLANT
GLOVE SURG UNDER POLY LF SZ6.5 (GLOVE) ×1 IMPLANT
GLOVE SURG UNDER POLY LF SZ7 (GLOVE) ×1 IMPLANT
NDL HYPO 18GX1.5 BLUNT FILL (NEEDLE) IMPLANT
NEEDLE HYPO 18GX1.5 BLUNT FILL (NEEDLE) ×2 IMPLANT
PAD ARMBOARD 7.5X6 YLW CONV (MISCELLANEOUS) ×1 IMPLANT
RayOne EMV US (Intraocular Lens) ×1 IMPLANT
SYR TB 1ML LL NO SAFETY (SYRINGE) ×1 IMPLANT
TAPE SURG TRANSPORE 1 IN (GAUZE/BANDAGES/DRESSINGS) IMPLANT
TAPE SURGICAL TRANSPORE 1 IN (GAUZE/BANDAGES/DRESSINGS) ×2
WATER STERILE IRR 250ML POUR (IV SOLUTION) ×1 IMPLANT

## 2021-08-04 NOTE — Transfer of Care (Signed)
Immediate Anesthesia Transfer of Care Note  Patient: Jennifer Mullen  Procedure(s) Performed: CATARACT EXTRACTION RIGHT EYE W/ PHACO AND INTRAOCULAR LENS PLACEMENT (IOC) (Right: Eye)  Patient Location: PACU  Anesthesia Type:MAC  Level of Consciousness: awake, alert , oriented and patient cooperative  Airway & Oxygen Therapy: Patient Spontanous Breathing  Post-op Assessment: Report given to RN, Post -op Vital signs reviewed and stable and Patient moving all extremities X 4  Post vital signs: Reviewed and stable  Last Vitals:  Vitals Value Taken Time  BP    Temp    Pulse    Resp    SpO2      Last Pain:  Vitals:   08/04/21 1317  TempSrc: Oral  PainSc: 0-No pain      Patients Stated Pain Goal: 6 (07/86/75 4492)  Complications: No notable events documented.

## 2021-08-04 NOTE — Anesthesia Postprocedure Evaluation (Signed)
Anesthesia Post Note  Patient: Jennifer Mullen  Procedure(s) Performed: CATARACT EXTRACTION RIGHT EYE W/ PHACO AND INTRAOCULAR LENS PLACEMENT (IOC) (Right: Eye)  Patient location during evaluation: Phase II Anesthesia Type: MAC Level of consciousness: awake and alert and oriented Pain management: pain level controlled Vital Signs Assessment: post-procedure vital signs reviewed and stable Respiratory status: spontaneous breathing, nonlabored ventilation and respiratory function stable Cardiovascular status: blood pressure returned to baseline and stable Postop Assessment: no apparent nausea or vomiting Anesthetic complications: no   No notable events documented.   Last Vitals:  Vitals:   08/04/21 1317 08/04/21 1414  BP: 140/75 (!) 143/72  Pulse:  70  Resp: 20 16  Temp: 36.6 C 36.6 C  SpO2: 100% 98%    Last Pain:  Vitals:   08/04/21 1414  TempSrc: Oral  PainSc: 0-No pain                 Timothey Dahlstrom C Tamanika Heiney

## 2021-08-04 NOTE — Op Note (Signed)
Date of procedure: 08/04/21  Pre-operative diagnosis: Visually significant age-related nuclear cataract, Right Eye (H25.11)  Post-operative diagnosis: Visually significant age-related nuclear cataract, Right Eye  Procedure: Removal of cataract via phacoemulsification and insertion of intra-ocular lens Rayner [id number] +21.5D into the capsular bag of the Right Eye  Attending surgeon: Gerda Diss. Anntoinette Haefele, MD, MA  Anesthesia: MAC, Topical Akten  Complications: None  Estimated Blood Loss: <76m (minimal)  Specimens: None  Implants: As above  Indications:  Visually significant age-related cataract, Right Eye  Procedure:  The patient was seen and identified in the pre-operative area. The operative eye was identified and dilated.  The operative eye was marked.  Topical anesthesia was administered to the operative eye.     The patient was then to the operative suite and placed in the supine position.  A timeout was performed confirming the patient, procedure to be performed, and all other relevant information.   The patient's face was prepped and draped in the usual fashion for intra-ocular surgery.  A lid speculum was placed into the operative eye and the surgical microscope moved into place and focused.  A superotemporal paracentesis was created using a 20 gauge paracentesis blade.  Shugarcaine was injected into the anterior chamber.  Viscoelastic was injected into the anterior chamber.  A temporal clear-corneal main wound incision was created using a 2.470mmicrokeratome.  A continuous curvilinear capsulorrhexis was initiated using an irrigating cystitome and completed using capsulorrhexis forceps.  Hydrodissection and hydrodeliniation were performed.  Viscoelastic was injected into the anterior chamber.  A phacoemulsification handpiece and a chopper as a second instrument were used to remove the nucleus and epinucleus. The irrigation/aspiration handpiece was used to remove any remaining cortical  material.   The capsular bag was reinflated with viscoelastic, checked, and found to be intact.  The intraocular lens was inserted into the capsular bag.  The irrigation/aspiration handpiece was used to remove any remaining viscoelastic.  The clear corneal wound and paracentesis wounds were then hydrated and checked with Weck-Cels to be watertight.  The lid-speculum and drape was removed, and the patient's face was cleaned with a wet and dry 4x4.  Maxitrol was instilled in the eye. A clear shield was taped over the eye. The patient was taken to the post-operative care unit in good condition, having tolerated the procedure well.  Post-Op Instructions: [facility] and will receive all other orders and instructions.

## 2021-08-04 NOTE — Discharge Instructions (Signed)
Please discharge patient when stable, will follow up today with Dr. Kimsey Demaree at the [facility] [place] office immediately following discharge.  Leave shield in place until visit.  All paperwork with discharge instructions will be given at the office.  [facility] Address:  [address]  [place], [place] [address]  

## 2021-08-04 NOTE — Anesthesia Preprocedure Evaluation (Signed)
Anesthesia Evaluation  Patient identified by MRN, date of birth, ID band Patient awake    Reviewed: Allergy & Precautions, NPO status , Patient's Chart, lab work & pertinent test results  History of Anesthesia Complications Negative for: history of anesthetic complications  Airway Mallampati: II  TM Distance: >3 FB Neck ROM: Full    Dental  (+) Edentulous Upper, Edentulous Lower   Pulmonary neg pulmonary ROS,    Pulmonary exam normal breath sounds clear to auscultation       Cardiovascular Exercise Tolerance: Good hypertension, Pt. on medications Normal cardiovascular exam Rhythm:Regular Rate:Normal     Neuro/Psych negative neurological ROS  negative psych ROS   GI/Hepatic negative GI ROS, Neg liver ROS,   Endo/Other  Hypothyroidism   Renal/GU negative Renal ROS     Musculoskeletal negative musculoskeletal ROS (+)   Abdominal   Peds  Hematology negative hematology ROS (+)   Anesthesia Other Findings   Reproductive/Obstetrics negative OB ROS                            Anesthesia Physical Anesthesia Plan  ASA: 2  Anesthesia Plan: MAC   Post-op Pain Management: Minimal or no pain anticipated   Induction:   PONV Risk Score and Plan:   Airway Management Planned: Nasal Cannula and Natural Airway  Additional Equipment:   Intra-op Plan:   Post-operative Plan:   Informed Consent: I have reviewed the patients History and Physical, chart, labs and discussed the procedure including the risks, benefits and alternatives for the proposed anesthesia with the patient or authorized representative who has indicated his/her understanding and acceptance.     Dental advisory given  Plan Discussed with: CRNA and Surgeon  Anesthesia Plan Comments:         Anesthesia Quick Evaluation

## 2021-08-04 NOTE — Interval H&P Note (Signed)
History and Physical Interval Note:  08/04/2021 1:46 PM  Jennifer Mullen  has presented today for surgery, with the diagnosis of nuclear cataract right eye.  The various methods of treatment have been discussed with the patient and family. After consideration of risks, benefits and other options for treatment, the patient has consented to  Procedure(s) with comments: CATARACT EXTRACTION PHACO AND INTRAOCULAR LENS PLACEMENT (IOC) (Right) - right as a surgical intervention.  The patient's history has been reviewed, patient examined, no change in status, stable for surgery.  I have reviewed the patient's chart and labs.  Questions were answered to the patient's satisfaction.     [name]

## 2021-08-05 ENCOUNTER — Encounter (HOSPITAL_COMMUNITY): Payer: Self-pay | Admitting: Ophthalmology

## 2021-08-14 ENCOUNTER — Encounter (HOSPITAL_COMMUNITY): Payer: Self-pay

## 2021-08-14 ENCOUNTER — Encounter (HOSPITAL_COMMUNITY)
Admission: RE | Admit: 2021-08-14 | Discharge: 2021-08-14 | Disposition: A | Discharge status: Home / Self Care | Payer: Medicare HMO | Source: Ambulatory Visit | Attending: Ophthalmology | Admitting: Ophthalmology

## 2021-08-14 ENCOUNTER — Other Ambulatory Visit: Payer: Self-pay

## 2021-08-19 NOTE — H&P (Addendum)
Surgical History & Physical  Patient Name: Jennifer Mullen DOB: 1946/05/16  Surgery: Cataract extraction with intraocular lens implant phacoemulsification; Left Eye  Surgeon: [name] MD Surgery Date:  08-29-21 Pre-Op Date:  08-11-21  HPI: A 89 Yr. old female patient The patient is returning after cataract surgery. The right eye is affected. Status post cataract surgery, which began 1 week ago: Since the last visit, the affected area is doing well. The patient's vision is improved and brighter. Patient is following medication instructions. Taking PO combo drops TID OD. Pt denies any eye pain or increase in floaters/flashes of light. pt states that she mainly has difficulty with reading small, fine print (especially in dim lighting). The left eye is affected. pt has difficulty with glare from bright lights and headlights as well. This is negatively affecting the patient's quality of life. HPI Completed by Dr. [name]  Medical History: Cataracts believes he may have glaucoma. unsure. Arthritis Thyroid Problems  Review of Systems Negative Allergic/Immunologic Negative Cardiovascular Negative Constitutional Negative Ear, Nose, Mouth & Throat Negative Endocrine Negative Eyes Negative Gastrointestinal Negative Genitourinary Negative Hemotologic/Lymphatic Negative Integumentary Negative Musculoskeletal Negative Neurological Negative Psychiatry Negative Respiratory  Social   Never smoked    Alcohol Never  Medication Prednisolone-Moxifloxacin-Bromfenac, ROSUVASTATIN, SYNTHROID,   Sx/Procedures Phaco c IOL OD, Hysterectomy, Appendectomy, Gall bladder removal,   Drug Allergies   NKDA  History & Physical: Heent: Cataract, Left Eye NECK: supple without bruits LUNGS: lungs clear to auscultation CV: regular rate and rhythm Abdomen: soft and non-tender  Impression & Plan: Assessment: 1.  CATARACT EXTRACTION STATUS; Right Eye (Z98.41) 2.  NUCLEAR SCLEROSIS AGE  RELATED; , Left Eye (H25.12)  Plan: 1.  1 week after cataract surgery. Doing well with improved vision and normal eye pressure. Call with any problems or concerns. Continue Pred-Moxi-Brom 2x/day for 3 more weeks.  2.  Cataract accounts for the patient's decreased vision. This visual impairment is not correctable with a tolerable change in glasses or contact lenses. Cataract surgery with an implantation of a new lens should significantly improve the visual and functional status of the patient. Discussed all risks, benefits, alternatives, and potential complications. Discussed the procedures and recovery. Patient desires to have surgery. A-scan ordered and performed today for intra-ocular lens calculations. The surgery will be performed in order to improve vision for driving, reading, and for eye examinations. Recommend phacoemulsification with intra-ocular lens. Recommend Dextenza for post-operative pain and inflammation. Left Eye. Surgery required to correct imbalance of vision. Dilates well - shugarcaine by protocol.

## 2021-08-21 DIAGNOSIS — H2512 Age-related nuclear cataract, left eye: Secondary | ICD-10-CM | POA: Diagnosis not present

## 2021-08-22 MED ORDER — EPINEPHRINE PF 1 MG/ML IJ SOLN
INTRAMUSCULAR | Status: AC
  Filled 2021-08-22: qty 1

## 2021-08-22 MED ORDER — PHENYLEPHRINE-KETOROLAC 1-0.3 % IO SOLN
INTRAOCULAR | Status: AC
  Filled 2021-08-22: qty 4

## 2021-08-27 ENCOUNTER — Encounter (HOSPITAL_COMMUNITY)
Admission: RE | Admit: 2021-08-27 | Discharge: 2021-08-27 | Disposition: A | Discharge status: Home / Self Care | Payer: Medicare HMO | Source: Ambulatory Visit | Attending: Ophthalmology | Admitting: Ophthalmology

## 2021-08-29 ENCOUNTER — Ambulatory Visit (HOSPITAL_COMMUNITY): Payer: Medicare HMO | Admitting: Certified Registered"

## 2021-08-29 ENCOUNTER — Ambulatory Visit (HOSPITAL_COMMUNITY)
Admission: RE | Admit: 2021-08-29 | Discharge: 2021-08-29 | Disposition: A | Discharge status: Home / Self Care | Payer: Medicare HMO | Attending: Ophthalmology | Admitting: Ophthalmology

## 2021-08-29 ENCOUNTER — Encounter (HOSPITAL_COMMUNITY): Payer: Self-pay | Admitting: Ophthalmology

## 2021-08-29 ENCOUNTER — Encounter (HOSPITAL_COMMUNITY)
Admission: RE | Disposition: A | Discharge status: Home / Self Care | Payer: Self-pay | Source: Home / Self Care | Attending: Ophthalmology

## 2021-08-29 ENCOUNTER — Other Ambulatory Visit: Payer: Self-pay

## 2021-08-29 DIAGNOSIS — Z9841 Cataract extraction status, right eye: Secondary | ICD-10-CM | POA: Insufficient documentation

## 2021-08-29 DIAGNOSIS — E039 Hypothyroidism, unspecified: Secondary | ICD-10-CM | POA: Diagnosis not present

## 2021-08-29 DIAGNOSIS — H2512 Age-related nuclear cataract, left eye: Secondary | ICD-10-CM | POA: Insufficient documentation

## 2021-08-29 HISTORY — PX: CATARACT EXTRACTION W/PHACO: SHX586

## 2021-08-29 SURGERY — PHACOEMULSIFICATION, CATARACT, WITH IOL INSERTION
Anesthesia: Monitor Anesthesia Care | Site: Eye | Laterality: Left

## 2021-08-29 MED ORDER — TETRACAINE HCL 0.5 % OP SOLN
1.0000 [drp] | OPHTHALMIC | Status: AC | PRN
  Administered 2021-08-29 (×3): 1 [drp] via OPHTHALMIC

## 2021-08-29 MED ORDER — STERILE WATER FOR IRRIGATION IR SOLN
Status: DC | PRN
  Administered 2021-08-29: 250 mL

## 2021-08-29 MED ORDER — BSS IO SOLN
INTRAOCULAR | Status: DC | PRN
  Administered 2021-08-29: 15 mL via INTRAOCULAR

## 2021-08-29 MED ORDER — NEOMYCIN-POLYMYXIN-DEXAMETH 3.5-10000-0.1 OP SUSP
OPHTHALMIC | Status: DC | PRN
  Administered 2021-08-29: 1 [drp] via OPHTHALMIC

## 2021-08-29 MED ORDER — LIDOCAINE HCL 3.5 % OP GEL
1.0000 "application " | Freq: Once | OPHTHALMIC | Status: AC
  Administered 2021-08-29: 1 via OPHTHALMIC

## 2021-08-29 MED ORDER — SODIUM HYALURONATE 10 MG/ML IO SOLUTION
PREFILLED_SYRINGE | INTRAOCULAR | Status: DC | PRN
  Administered 2021-08-29: 0.85 mL via INTRAOCULAR

## 2021-08-29 MED ORDER — EPINEPHRINE PF 1 MG/ML IJ SOLN
INTRAMUSCULAR | Status: AC
  Filled 2021-08-29: qty 1

## 2021-08-29 MED ORDER — EPINEPHRINE PF 1 MG/ML IJ SOLN
INTRAOCULAR | Status: DC | PRN
  Administered 2021-08-29: 500 mL

## 2021-08-29 MED ORDER — PHENYLEPHRINE HCL 2.5 % OP SOLN
1.0000 [drp] | OPHTHALMIC | Status: AC | PRN
  Administered 2021-08-29 (×3): 1 [drp] via OPHTHALMIC

## 2021-08-29 MED ORDER — LIDOCAINE HCL (PF) 1 % IJ SOLN
INTRAOCULAR | Status: DC | PRN
  Administered 2021-08-29: 1 mL via OPHTHALMIC

## 2021-08-29 MED ORDER — MIDAZOLAM HCL 2 MG/2ML IJ SOLN
INTRAMUSCULAR | Status: AC
  Filled 2021-08-29: qty 2

## 2021-08-29 MED ORDER — SODIUM CHLORIDE 0.9% FLUSH
INTRAVENOUS | Status: DC | PRN
  Administered 2021-08-29: 5 mL via INTRAVENOUS

## 2021-08-29 MED ORDER — POVIDONE-IODINE 5 % OP SOLN
OPHTHALMIC | Status: DC | PRN
  Administered 2021-08-29: 1 via OPHTHALMIC

## 2021-08-29 MED ORDER — MIDAZOLAM HCL 2 MG/2ML IJ SOLN
INTRAMUSCULAR | Status: DC | PRN
  Administered 2021-08-29: 1 mg via INTRAVENOUS

## 2021-08-29 MED ORDER — PHENYLEPHRINE-KETOROLAC 1-0.3 % IO SOLN
INTRAOCULAR | Status: AC
  Filled 2021-08-29: qty 4

## 2021-08-29 MED ORDER — TROPICAMIDE 1 % OP SOLN
1.0000 [drp] | OPHTHALMIC | Status: AC | PRN
  Administered 2021-08-29 (×3): 1 [drp] via OPHTHALMIC
  Filled 2021-08-29: qty 2

## 2021-08-29 MED ORDER — SODIUM HYALURONATE 23MG/ML IO SOSY
PREFILLED_SYRINGE | INTRAOCULAR | Status: DC | PRN
  Administered 2021-08-29: 0.6 mL via INTRAOCULAR

## 2021-08-29 SURGICAL SUPPLY — 14 items
CATARACT SUITE SIGHTPATH (MISCELLANEOUS) ×2 IMPLANT
CLOTH BEACON ORANGE TIMEOUT ST (SAFETY) ×1 IMPLANT
EYE SHIELD UNIVERSAL CLEAR (GAUZE/BANDAGES/DRESSINGS) ×1 IMPLANT
FEE CATARACT SUITE SIGHTPATH (MISCELLANEOUS) IMPLANT
GLOVE SURG UNDER POLY LF SZ7 (GLOVE) ×2 IMPLANT
LENS IOL RAYNER 21.5 (Intraocular Lens) ×2 IMPLANT
LENS IOL RAYONE EMV 21.5 (Intraocular Lens) IMPLANT
NDL HYPO 18GX1.5 BLUNT FILL (NEEDLE) IMPLANT
NEEDLE HYPO 18GX1.5 BLUNT FILL (NEEDLE) ×2 IMPLANT
PAD ARMBOARD 7.5X6 YLW CONV (MISCELLANEOUS) ×1 IMPLANT
SYR TB 1ML LL NO SAFETY (SYRINGE) ×1 IMPLANT
TAPE SURG TRANSPORE 1 IN (GAUZE/BANDAGES/DRESSINGS) IMPLANT
TAPE SURGICAL TRANSPORE 1 IN (GAUZE/BANDAGES/DRESSINGS) ×2
WATER STERILE IRR 250ML POUR (IV SOLUTION) ×1 IMPLANT

## 2021-08-29 NOTE — Anesthesia Postprocedure Evaluation (Signed)
Anesthesia Post Note  Patient: Jennifer Mullen  Procedure(s) Performed: CATARACT EXTRACTION PHACO AND INTRAOCULAR LENS PLACEMENT (IOC) (Left: Eye)  Patient location during evaluation: Phase II Anesthesia Type: MAC Level of consciousness: awake Pain management: pain level controlled Vital Signs Assessment: post-procedure vital signs reviewed and stable Respiratory status: spontaneous breathing and respiratory function stable Cardiovascular status: blood pressure returned to baseline and stable Postop Assessment: no headache and no apparent nausea or vomiting Anesthetic complications: no Comments: Late entry   No notable events documented.   Last Vitals:  Vitals:   08/29/21 1102  BP: (!) 143/73  Pulse: 67  Resp: 16  Temp: 37 C  SpO2: 99%    Last Pain:  Vitals:   08/29/21 1102  TempSrc: Oral  PainSc: 0-No pain                 Louann Sjogren

## 2021-08-29 NOTE — Interval H&P Note (Signed)
History and Physical Interval Note:  08/29/2021 11:35 AM  Jennifer Mullen  has presented today for surgery, with the diagnosis of nuclear sclerosis age related cataract left eye.  The various methods of treatment have been discussed with the patient and family. After consideration of risks, benefits and other options for treatment, the patient has consented to  Procedure(s) with comments: CATARACT EXTRACTION PHACO AND INTRAOCULAR LENS PLACEMENT (IOC) (Left) - left, pt knows to arrive at 11:00 as a surgical intervention.  The patient's history has been reviewed, patient examined, no change in status, stable for surgery.  I have reviewed the patient's chart and labs.  Questions were answered to the patient's satisfaction.     Baruch Goldmann

## 2021-08-29 NOTE — Anesthesia Procedure Notes (Signed)
Procedure Name: MAC Date/Time: 08/29/2021 12:21 PM Performed by: Orlie Dakin, CRNA Pre-anesthesia Checklist: Patient identified, Emergency Drugs available, Suction available and Patient being monitored Patient Re-evaluated:Patient Re-evaluated prior to induction Oxygen Delivery Method: Nasal cannula Placement Confirmation: positive ETCO2

## 2021-08-29 NOTE — Op Note (Signed)
Date of procedure: 08/29/21  Pre-operative diagnosis: Visually significant age-related nuclear cataract, Left Eye (H25.12)  Post-operative diagnosis: Visually significant age-related nuclear cataract, Left Eye  Procedure: Removal of cataract via phacoemulsification and insertion of intra-ocular lens Rayner [id number] +21.5D into the capsular bag of the Left Eye  Attending surgeon: Gerda Diss. Briele Lagasse, MD, MA  Anesthesia: MAC, Topical Akten  Complications: None  Estimated Blood Loss: <56m (minimal)  Specimens: None  Implants: As above  Indications:  Visually significant age-related cataract, Left Eye  Procedure:  The patient was seen and identified in the pre-operative area. The operative eye was identified and dilated.  The operative eye was marked.  Topical anesthesia was administered to the operative eye.     The patient was then to the operative suite and placed in the supine position.  A timeout was performed confirming the patient, procedure to be performed, and all other relevant information.   The patient's face was prepped and draped in the usual fashion for intra-ocular surgery.  A lid speculum was placed into the operative eye and the surgical microscope moved into place and focused.  An inferotemporal paracentesis was created using a 20 gauge paracentesis blade.  Shugarcaine was injected into the anterior chamber.  Viscoelastic was injected into the anterior chamber.  A temporal clear-corneal main wound incision was created using a 2.463mmicrokeratome.  A continuous curvilinear capsulorrhexis was initiated using an irrigating cystitome and completed using capsulorrhexis forceps.  Hydrodissection and hydrodeliniation were performed.  Viscoelastic was injected into the anterior chamber.  A phacoemulsification handpiece and a chopper as a second instrument were used to remove the nucleus and epinucleus. The irrigation/aspiration handpiece was used to remove any remaining cortical material.    The capsular bag was reinflated with viscoelastic, checked, and found to be intact.  The intraocular lens was inserted into the capsular bag.  The irrigation/aspiration handpiece was used to remove any remaining viscoelastic.  The clear corneal wound and paracentesis wounds were then hydrated and checked with Weck-Cels to be watertight.  The lid-speculum and drape was removed, and the patient's face was cleaned with a wet and dry 4x4.  Maxitrol was instilled in the eye. A clear shield was taped over the eye. The patient was taken to the post-operative care unit in good condition, having tolerated the procedure well.  Post-Op Instructions: [facility] and will receive all other orders and instructions.

## 2021-08-29 NOTE — Anesthesia Preprocedure Evaluation (Signed)
Anesthesia Evaluation  Patient identified by MRN, date of birth, ID band Patient awake    Reviewed: Allergy & Precautions, H&P , NPO status , Patient's Chart, lab work & pertinent test results, reviewed documented beta blocker date and time   History of Anesthesia Complications Negative for: history of anesthetic complications  Airway Mallampati: II  TM Distance: >3 FB Neck ROM: full    Dental no notable dental hx. (+) Edentulous Upper, Edentulous Lower   Pulmonary neg pulmonary ROS,    Pulmonary exam normal breath sounds clear to auscultation       Cardiovascular Exercise Tolerance: Good hypertension, Pt. on medications negative cardio ROS Normal cardiovascular exam Rhythm:regular Rate:Normal     Neuro/Psych negative neurological ROS  negative psych ROS   GI/Hepatic negative GI ROS, Neg liver ROS,   Endo/Other  Hypothyroidism   Renal/GU negative Renal ROS  negative genitourinary   Musculoskeletal negative musculoskeletal ROS (+)   Abdominal   Peds  Hematology negative hematology ROS (+)   Anesthesia Other Findings   Reproductive/Obstetrics negative OB ROS                             Anesthesia Physical  Anesthesia Plan  ASA: 2  Anesthesia Plan: MAC   Post-op Pain Management: Minimal or no pain anticipated   Induction:   PONV Risk Score and Plan:   Airway Management Planned: Nasal Cannula and Natural Airway  Additional Equipment:   Intra-op Plan:   Post-operative Plan:   Informed Consent: I have reviewed the patients History and Physical, chart, labs and discussed the procedure including the risks, benefits and alternatives for the proposed anesthesia with the patient or authorized representative who has indicated his/her understanding and acceptance.     Dental Advisory Given  Plan Discussed with: CRNA  Anesthesia Plan Comments:         Anesthesia Quick  Evaluation

## 2021-08-29 NOTE — Transfer of Care (Signed)
Immediate Anesthesia Transfer of Care Note  Patient: Jennifer Mullen  Procedure(s) Performed: CATARACT EXTRACTION PHACO AND INTRAOCULAR LENS PLACEMENT (IOC) (Left: Eye)  Patient Location: Short Stay  Anesthesia Type:MAC  Level of Consciousness: awake, alert  and oriented  Airway & Oxygen Therapy: Patient Spontanous Breathing  Post-op Assessment: Report given to RN, Post -op Vital signs reviewed and stable and Patient moving all extremities X 4  Post vital signs: Reviewed and stable  Last Vitals:  Vitals Value Taken Time  BP    Temp    Pulse    Resp    SpO2      Last Pain:  Vitals:   08/29/21 1102  TempSrc: Oral  PainSc: 0-No pain         Complications: No notable events documented.

## 2021-08-29 NOTE — Discharge Instructions (Signed)
Please discharge patient when stable, will follow up today with Dr. Karimah Winquist at the [facility] [place] office immediately following discharge.  Leave shield in place until visit.  All paperwork with discharge instructions will be given at the office.   [facility] Address:  [address]  , [place] [address]  

## 2021-09-01 DIAGNOSIS — E034 Atrophy of thyroid (acquired): Secondary | ICD-10-CM | POA: Diagnosis not present

## 2021-09-01 DIAGNOSIS — I10 Essential (primary) hypertension: Secondary | ICD-10-CM | POA: Diagnosis not present

## 2021-09-02 ENCOUNTER — Encounter (HOSPITAL_COMMUNITY): Payer: Self-pay | Admitting: Ophthalmology

## 2021-09-05 DIAGNOSIS — I10 Essential (primary) hypertension: Secondary | ICD-10-CM | POA: Diagnosis not present

## 2021-09-05 DIAGNOSIS — E039 Hypothyroidism, unspecified: Secondary | ICD-10-CM | POA: Diagnosis not present

## 2021-09-05 DIAGNOSIS — M150 Primary generalized (osteo)arthritis: Secondary | ICD-10-CM | POA: Diagnosis not present

## 2021-09-21 DIAGNOSIS — E039 Hypothyroidism, unspecified: Secondary | ICD-10-CM | POA: Diagnosis not present

## 2021-09-21 DIAGNOSIS — E7849 Other hyperlipidemia: Secondary | ICD-10-CM | POA: Diagnosis not present

## 2021-09-21 DIAGNOSIS — I10 Essential (primary) hypertension: Secondary | ICD-10-CM | POA: Diagnosis not present

## 2021-11-20 ENCOUNTER — Other Ambulatory Visit (HOSPITAL_COMMUNITY): Payer: Self-pay | Admitting: Family Medicine

## 2021-11-20 DIAGNOSIS — Z1231 Encounter for screening mammogram for malignant neoplasm of breast: Secondary | ICD-10-CM

## 2021-11-26 ENCOUNTER — Ambulatory Visit (HOSPITAL_COMMUNITY)
Admission: RE | Admit: 2021-11-26 | Discharge: 2021-11-26 | Disposition: A | Discharge status: Home / Self Care | Payer: Medicare HMO | Source: Ambulatory Visit | Attending: Family Medicine | Admitting: Family Medicine

## 2021-11-26 DIAGNOSIS — Z1231 Encounter for screening mammogram for malignant neoplasm of breast: Secondary | ICD-10-CM | POA: Diagnosis not present

## 2021-12-01 DIAGNOSIS — E039 Hypothyroidism, unspecified: Secondary | ICD-10-CM | POA: Diagnosis not present

## 2021-12-05 DIAGNOSIS — K027 Dental root caries: Secondary | ICD-10-CM | POA: Diagnosis not present

## 2021-12-05 DIAGNOSIS — R634 Abnormal weight loss: Secondary | ICD-10-CM | POA: Diagnosis not present

## 2021-12-05 DIAGNOSIS — E039 Hypothyroidism, unspecified: Secondary | ICD-10-CM | POA: Diagnosis not present

## 2021-12-21 DIAGNOSIS — E7849 Other hyperlipidemia: Secondary | ICD-10-CM | POA: Diagnosis not present

## 2021-12-21 DIAGNOSIS — I10 Essential (primary) hypertension: Secondary | ICD-10-CM | POA: Diagnosis not present

## 2022-02-09 DIAGNOSIS — K027 Dental root caries: Secondary | ICD-10-CM | POA: Diagnosis not present

## 2022-02-09 DIAGNOSIS — R634 Abnormal weight loss: Secondary | ICD-10-CM | POA: Diagnosis not present

## 2022-02-09 DIAGNOSIS — E039 Hypothyroidism, unspecified: Secondary | ICD-10-CM | POA: Diagnosis not present

## 2022-02-13 DIAGNOSIS — Z0000 Encounter for general adult medical examination without abnormal findings: Secondary | ICD-10-CM | POA: Diagnosis not present

## 2022-02-13 DIAGNOSIS — E0500 Thyrotoxicosis with diffuse goiter without thyrotoxic crisis or storm: Secondary | ICD-10-CM | POA: Diagnosis not present

## 2022-03-23 DIAGNOSIS — E7849 Other hyperlipidemia: Secondary | ICD-10-CM | POA: Diagnosis not present

## 2022-03-23 DIAGNOSIS — E039 Hypothyroidism, unspecified: Secondary | ICD-10-CM | POA: Diagnosis not present

## 2022-03-23 DIAGNOSIS — I10 Essential (primary) hypertension: Secondary | ICD-10-CM | POA: Diagnosis not present

## 2022-03-31 DIAGNOSIS — Z0100 Encounter for examination of eyes and vision without abnormal findings: Secondary | ICD-10-CM | POA: Diagnosis not present

## 2022-03-31 DIAGNOSIS — H524 Presbyopia: Secondary | ICD-10-CM | POA: Diagnosis not present

## 2022-05-14 DIAGNOSIS — E0500 Thyrotoxicosis with diffuse goiter without thyrotoxic crisis or storm: Secondary | ICD-10-CM | POA: Diagnosis not present

## 2022-05-15 DIAGNOSIS — I7300 Raynaud's syndrome without gangrene: Secondary | ICD-10-CM | POA: Diagnosis not present

## 2022-05-15 DIAGNOSIS — M130 Polyarthritis, unspecified: Secondary | ICD-10-CM | POA: Diagnosis not present

## 2022-05-15 DIAGNOSIS — E7849 Other hyperlipidemia: Secondary | ICD-10-CM | POA: Diagnosis not present

## 2022-05-15 DIAGNOSIS — I739 Peripheral vascular disease, unspecified: Secondary | ICD-10-CM | POA: Diagnosis not present

## 2022-05-15 DIAGNOSIS — I10 Essential (primary) hypertension: Secondary | ICD-10-CM | POA: Diagnosis not present

## 2022-05-30 ENCOUNTER — Emergency Department (HOSPITAL_COMMUNITY)
Admission: EM | Admit: 2022-05-30 | Discharge: 2022-05-30 | Disposition: A | Discharge status: Home / Self Care | Payer: Medicare HMO | Attending: Emergency Medicine | Admitting: Emergency Medicine

## 2022-05-30 ENCOUNTER — Emergency Department (HOSPITAL_COMMUNITY): Payer: Medicare HMO

## 2022-05-30 ENCOUNTER — Other Ambulatory Visit: Payer: Self-pay

## 2022-05-30 ENCOUNTER — Encounter (HOSPITAL_COMMUNITY): Payer: Self-pay | Admitting: Emergency Medicine

## 2022-05-30 DIAGNOSIS — R1013 Epigastric pain: Secondary | ICD-10-CM | POA: Diagnosis not present

## 2022-05-30 DIAGNOSIS — E039 Hypothyroidism, unspecified: Secondary | ICD-10-CM | POA: Insufficient documentation

## 2022-05-30 DIAGNOSIS — K7689 Other specified diseases of liver: Secondary | ICD-10-CM | POA: Diagnosis not present

## 2022-05-30 DIAGNOSIS — I10 Essential (primary) hypertension: Secondary | ICD-10-CM | POA: Insufficient documentation

## 2022-05-30 DIAGNOSIS — Z79899 Other long term (current) drug therapy: Secondary | ICD-10-CM | POA: Insufficient documentation

## 2022-05-30 DIAGNOSIS — R110 Nausea: Secondary | ICD-10-CM | POA: Insufficient documentation

## 2022-05-30 DIAGNOSIS — Z20822 Contact with and (suspected) exposure to covid-19: Secondary | ICD-10-CM | POA: Insufficient documentation

## 2022-05-30 LAB — CBC WITH DIFFERENTIAL/PLATELET
Abs Immature Granulocytes: 0.02 10*3/uL (ref 0.00–0.07)
Basophils Absolute: 0 10*3/uL (ref 0.0–0.1)
Basophils Relative: 1 %
Eosinophils Absolute: 0.2 10*3/uL (ref 0.0–0.5)
Eosinophils Relative: 3 %
HCT: 39.2 % (ref 36.0–46.0)
Hemoglobin: 12.4 g/dL (ref 12.0–15.0)
Immature Granulocytes: 0 %
Lymphocytes Relative: 23 %
Lymphs Abs: 1.5 10*3/uL (ref 0.7–4.0)
MCH: 26.7 pg (ref 26.0–34.0)
MCHC: 31.6 g/dL (ref 30.0–36.0)
MCV: 84.5 fL (ref 80.0–100.0)
Monocytes Absolute: 0.5 10*3/uL (ref 0.1–1.0)
Monocytes Relative: 7 %
Neutro Abs: 4.3 10*3/uL (ref 1.7–7.7)
Neutrophils Relative %: 66 %
Platelets: 213 10*3/uL (ref 150–400)
RBC: 4.64 MIL/uL (ref 3.87–5.11)
RDW: 14 % (ref 11.5–15.5)
WBC: 6.5 10*3/uL (ref 4.0–10.5)
nRBC: 0 % (ref 0.0–0.2)

## 2022-05-30 LAB — COMPREHENSIVE METABOLIC PANEL
ALT: 37 U/L (ref 0–44)
AST: 102 U/L — ABNORMAL HIGH (ref 15–41)
Albumin: 4.4 g/dL (ref 3.5–5.0)
Alkaline Phosphatase: 132 U/L — ABNORMAL HIGH (ref 38–126)
Anion gap: 7 (ref 5–15)
BUN: 13 mg/dL (ref 8–23)
CO2: 27 mmol/L (ref 22–32)
Calcium: 9 mg/dL (ref 8.9–10.3)
Chloride: 106 mmol/L (ref 98–111)
Creatinine, Ser: 0.81 mg/dL (ref 0.44–1.00)
GFR, Estimated: >60 mL/min (ref >60)
Glucose, Bld: 134 mg/dL — ABNORMAL HIGH (ref 70–99)
Potassium: 4.4 mmol/L (ref 3.5–5.1)
Sodium: 140 mmol/L (ref 135–145)
Total Bilirubin: 0.9 mg/dL (ref 0.3–1.2)
Total Protein: 7.4 g/dL (ref 6.5–8.1)

## 2022-05-30 LAB — URINALYSIS, ROUTINE W REFLEX MICROSCOPIC
Bacteria, UA: NONE SEEN
Bilirubin Urine: NEGATIVE
Glucose, UA: NEGATIVE mg/dL
Hgb urine dipstick: NEGATIVE
Ketones, ur: NEGATIVE mg/dL
Nitrite: NEGATIVE
Protein, ur: NEGATIVE mg/dL
Specific Gravity, Urine: 1.023 (ref 1.005–1.030)
pH: 6 (ref 5.0–8.0)

## 2022-05-30 LAB — LIPASE, BLOOD: Lipase: 68 U/L — ABNORMAL HIGH (ref 11–51)

## 2022-05-30 LAB — SARS CORONAVIRUS 2 BY RT PCR: SARS Coronavirus 2 by RT PCR: NEGATIVE

## 2022-05-30 MED ORDER — HYDROCODONE-ACETAMINOPHEN 5-325 MG PO TABS: 1.0000 | ORAL_TABLET | Freq: Four times a day (QID) | ORAL | 0 refills | Status: DC | PRN

## 2022-05-30 MED ORDER — SODIUM CHLORIDE 0.9 % IV BOLUS
1000.0000 mL | Freq: Once | INTRAVENOUS | Status: AC
  Administered 2022-05-30: 1000 mL via INTRAVENOUS

## 2022-05-30 MED ORDER — ONDANSETRON HCL 4 MG/2ML IJ SOLN
4.0000 mg | Freq: Once | INTRAMUSCULAR | Status: AC
  Administered 2022-05-30: 4 mg via INTRAVENOUS
  Filled 2022-05-30: qty 2

## 2022-05-30 MED ORDER — MORPHINE SULFATE (PF) 4 MG/ML IV SOLN
4.0000 mg | Freq: Once | INTRAVENOUS | Status: AC
  Administered 2022-05-30: 4 mg via INTRAVENOUS
  Filled 2022-05-30: qty 1

## 2022-05-30 MED ORDER — IOHEXOL 300 MG/ML  SOLN
100.0000 mL | Freq: Once | INTRAMUSCULAR | Status: AC | PRN
  Administered 2022-05-30: 100 mL via INTRAVENOUS

## 2022-05-30 NOTE — ED Notes (Signed)
Patient transported to CT 

## 2022-05-30 NOTE — ED Provider Notes (Signed)
[facility] EMERGENCY DEPARTMENT Provider Note   CSN: [id number] Arrival date & time: 05/30/22  0143     History  Chief Complaint  Patient presents with   Abdominal Pain    Jennifer Mullen is a 76 y.o. female.  Patient is a 76 year old female with past medical history of hypothyroidism, hypertension, prior cholecystectomy and appendectomy.  Patient presenting today with complaints of abdominal pain.  This started approximately 1 hour prior to presentation.  She describes a sudden onset of pain to her upper abdomen with nausea but no vomiting.  She denies any diarrhea or constipation.  She denies fevers or chills.  She denies having consumed any spicy foods.  Pain is worse with palpation and there are no alleviating factors.  The history is provided by the patient.       Home Medications Prior to Admission medications   Medication Sig Start Date End Date Taking? Authorizing Provider  Acetaminophen (TYLENOL PO) Take by mouth as needed.    [provider]  amLODipine (NORVASC) 2.5 MG tablet Take 1 tablet (2.5 mg total) by mouth daily. 10/27/18 11/17/19  Bensimhon, Shaune Pascal, MD  CRESTOR 20 MG tablet Take 1 tablet by mouth daily. 03/26/14   [provider]  cyclobenzaprine (FLEXERIL) 10 MG tablet Take 10 mg by mouth 3 (three) times daily. 02/18/19   [provider]  hydroxychloroquine (PLAQUENIL) 200 MG tablet Take 1 tablet (200 mg total) by mouth daily. 02/05/20   Bo Merino, MD  levothyroxine (SYNTHROID) 112 MCG tablet Take 112 mcg by mouth daily before breakfast.    [provider]  SYNTHROID 50 MCG tablet every other day. Alternating with Synthroid 61mg every other day 05/22/18   [provider]  SYNTHROID 75 MCG tablet Take 75 mcg by mouth every other day.  11/12/19   [provider]      Allergies    Patient has no known allergies.    Review of Systems   Review of Systems  All other systems reviewed and are  negative.   Physical Exam Updated Vital Signs BP (!) 173/67 (BP Location: Left Arm)   Pulse (!) 48   Resp 12   Ht 5' 3"  (1.6 m)   Wt 50.8 kg   SpO2 100%   BMI 19.84 kg/m  Physical Exam Vitals and nursing note reviewed.  Constitutional:      General: She is not in acute distress.    Appearance: She is well-developed. She is not diaphoretic.  HENT:     Head: Normocephalic and atraumatic.  Cardiovascular:     Rate and Rhythm: Normal rate and regular rhythm.     Heart sounds: No murmur heard.    No friction rub. No gallop.  Pulmonary:     Effort: Pulmonary effort is normal. No respiratory distress.     Breath sounds: Normal breath sounds. No wheezing.  Abdominal:     General: Bowel sounds are normal. There is no distension.     Palpations: Abdomen is soft.     Tenderness: There is abdominal tenderness in the right upper quadrant, epigastric area and left upper quadrant. There is no right CVA tenderness, left CVA tenderness, guarding or rebound.  Musculoskeletal:        General: Normal range of motion.     Cervical back: Normal range of motion and neck supple.  Skin:    General: Skin is warm and dry.  Neurological:     General: No focal deficit present.  Mental Status: She is alert and oriented to person, place, and time.     ED Results / Procedures / Treatments   Labs (all labs ordered are listed, but only abnormal results are displayed) Labs Reviewed  COMPREHENSIVE METABOLIC PANEL  CBC WITH DIFFERENTIAL/PLATELET  LIPASE, BLOOD  URINALYSIS, ROUTINE W REFLEX MICROSCOPIC    EKG None  Radiology No results found.  Procedures Procedures    Medications Ordered in ED Medications  sodium chloride 0.9 % bolus 1,000 mL (has no administration in time range)  ondansetron (ZOFRAN) injection 4 mg (has no administration in time range)  morphine (PF) 4 MG/ML injection 4 mg (has no administration in time range)    ED Course/ Medical Decision Making/ A&P  Patient  presenting here with complaints of upper abdominal pain as described in the HPI.  She arrives here with stable vital signs and is afebrile.  Work-up initiated including CBC, CMP, lipase, COVID test, and CT scan of the abdomen and pelvis.  There is no leukocytosis and electrolytes are unremarkable with the exception of mild elevations of the AST and alk phos and lipase, the significance of which I am uncertain.  Patient is status postcholecystectomy in the past CT scan fails to show any acute intra-abdominal process.  There is no evidence for pancreatitis or findings consistent with retained gallstone.  Patient seems to be feeling better after receiving IV fluids and morphine/Zofran.  At this point, I feel as though she can safely be discharged with outpatient follow-up.  She will be prescribed medication for pain and advised to adhere to a clear liquid diet for the next 24 hours.  She is to return if she worsens and follow-up with primary doctor if not improving.  Final Clinical Impression(s) / ED Diagnoses Final diagnoses:  None    Rx / DC Orders ED Discharge Orders     None         Veryl Speak, MD 05/30/22 [phone]

## 2022-05-30 NOTE — Discharge Instructions (Signed)
Take hydrocodone as prescribed as needed for pain.  Clear liquid diet for the next 24 hours, then slowly advance to normal as tolerated.  Follow-up with your primary doctor if not improving in the next few days, and return to the ER if you develop worsening pain, high fever, bloody stool or vomit, or for other new and concerning symptoms.

## 2022-05-30 NOTE — ED Triage Notes (Signed)
C/o abdominal pain in RUQ, LUQ that started roughly 40 min ago. Denies n/v/d at this time. Denies fever. Denies any urinary symptoms.

## 2022-06-04 DIAGNOSIS — Z1211 Encounter for screening for malignant neoplasm of colon: Secondary | ICD-10-CM | POA: Diagnosis not present

## 2022-06-04 DIAGNOSIS — Z1212 Encounter for screening for malignant neoplasm of rectum: Secondary | ICD-10-CM | POA: Diagnosis not present

## 2022-06-19 ENCOUNTER — Telehealth: Payer: Self-pay

## 2022-06-19 NOTE — Telephone Encounter (Signed)
     Reason for call: ED-Follow up call    Patient visited [place] on 05/30/2022  or abdominal pain  Telephone encounter attempt :  1st Attempt  A HIPAA compliant voice message was left requesting a return call.  Instructed patient to call back at [phone] at their earliest convenience.  [facility] management  [place], [place]  Main Phone: [phone]  E-mail: [email or web address]  Website: [email or web address]

## 2022-06-23 DIAGNOSIS — I10 Essential (primary) hypertension: Secondary | ICD-10-CM | POA: Diagnosis not present

## 2022-06-23 DIAGNOSIS — E7849 Other hyperlipidemia: Secondary | ICD-10-CM | POA: Diagnosis not present

## 2022-06-23 DIAGNOSIS — E039 Hypothyroidism, unspecified: Secondary | ICD-10-CM | POA: Diagnosis not present

## 2022-07-03 ENCOUNTER — Telehealth: Payer: Self-pay

## 2022-07-03 NOTE — Telephone Encounter (Signed)
    Reason for call: ED-Follow up call    Patient visited [place] on 05/30/2022  or abdominal pain  Have you been able to follow up with your primary care physician? - Yes, Spoke to her  The patient was or was not able to obtain any needed medicine or equipment. - Was, Yes  Are there diet recommendations that you are having difficulty following? - No  Patient expresses understanding of discharge instructions and education provided has no other needs at this time.     [facility] Guide, Embedded Care Coordination [facility]  [place], [place] [address]  Main Phone: [phone]  E-mail: [email or web address]  Website: [email or web address]

## 2022-08-10 DIAGNOSIS — I10 Essential (primary) hypertension: Secondary | ICD-10-CM | POA: Diagnosis not present

## 2022-08-10 DIAGNOSIS — I739 Peripheral vascular disease, unspecified: Secondary | ICD-10-CM | POA: Diagnosis not present

## 2022-08-10 DIAGNOSIS — E7849 Other hyperlipidemia: Secondary | ICD-10-CM | POA: Diagnosis not present

## 2022-08-10 DIAGNOSIS — E039 Hypothyroidism, unspecified: Secondary | ICD-10-CM | POA: Diagnosis not present

## 2022-08-14 DIAGNOSIS — E039 Hypothyroidism, unspecified: Secondary | ICD-10-CM | POA: Diagnosis not present

## 2022-08-14 DIAGNOSIS — I10 Essential (primary) hypertension: Secondary | ICD-10-CM | POA: Diagnosis not present

## 2022-08-14 DIAGNOSIS — T382X6D Underdosing of antithyroid drugs, subsequent encounter: Secondary | ICD-10-CM | POA: Diagnosis not present

## 2022-11-11 DIAGNOSIS — E039 Hypothyroidism, unspecified: Secondary | ICD-10-CM | POA: Diagnosis not present

## 2022-11-11 DIAGNOSIS — I10 Essential (primary) hypertension: Secondary | ICD-10-CM | POA: Diagnosis not present

## 2022-11-11 DIAGNOSIS — I7300 Raynaud's syndrome without gangrene: Secondary | ICD-10-CM | POA: Diagnosis not present

## 2022-11-11 DIAGNOSIS — E559 Vitamin D deficiency, unspecified: Secondary | ICD-10-CM | POA: Diagnosis not present

## 2022-11-11 DIAGNOSIS — T382X6D Underdosing of antithyroid drugs, subsequent encounter: Secondary | ICD-10-CM | POA: Diagnosis not present

## 2022-11-13 DIAGNOSIS — I10 Essential (primary) hypertension: Secondary | ICD-10-CM | POA: Diagnosis not present

## 2022-11-13 DIAGNOSIS — E039 Hypothyroidism, unspecified: Secondary | ICD-10-CM | POA: Diagnosis not present

## 2023-02-08 DIAGNOSIS — Z6820 Body mass index (BMI) 20.0-20.9, adult: Secondary | ICD-10-CM | POA: Diagnosis not present

## 2023-02-08 DIAGNOSIS — E039 Hypothyroidism, unspecified: Secondary | ICD-10-CM | POA: Diagnosis not present

## 2023-02-08 DIAGNOSIS — I10 Essential (primary) hypertension: Secondary | ICD-10-CM | POA: Diagnosis not present

## 2023-02-12 DIAGNOSIS — E039 Hypothyroidism, unspecified: Secondary | ICD-10-CM | POA: Diagnosis not present

## 2023-02-12 DIAGNOSIS — I10 Essential (primary) hypertension: Secondary | ICD-10-CM | POA: Diagnosis not present

## 2023-02-12 DIAGNOSIS — G4489 Other headache syndrome: Secondary | ICD-10-CM | POA: Diagnosis not present

## 2023-03-30 DIAGNOSIS — E039 Hypothyroidism, unspecified: Secondary | ICD-10-CM | POA: Diagnosis not present

## 2023-04-02 DIAGNOSIS — H8319 Labyrinthine fistula, unspecified ear: Secondary | ICD-10-CM | POA: Diagnosis not present

## 2023-04-02 DIAGNOSIS — E0500 Thyrotoxicosis with diffuse goiter without thyrotoxic crisis or storm: Secondary | ICD-10-CM | POA: Diagnosis not present

## 2023-04-02 DIAGNOSIS — I10 Essential (primary) hypertension: Secondary | ICD-10-CM | POA: Diagnosis not present

## 2023-04-02 DIAGNOSIS — Z0001 Encounter for general adult medical examination with abnormal findings: Secondary | ICD-10-CM | POA: Diagnosis not present

## 2023-04-02 DIAGNOSIS — I7300 Raynaud's syndrome without gangrene: Secondary | ICD-10-CM | POA: Diagnosis not present

## 2023-04-05 DIAGNOSIS — H04123 Dry eye syndrome of bilateral lacrimal glands: Secondary | ICD-10-CM | POA: Diagnosis not present

## 2023-06-28 DIAGNOSIS — E559 Vitamin D deficiency, unspecified: Secondary | ICD-10-CM | POA: Diagnosis not present

## 2023-06-28 DIAGNOSIS — I7300 Raynaud's syndrome without gangrene: Secondary | ICD-10-CM | POA: Diagnosis not present

## 2023-06-28 DIAGNOSIS — I10 Essential (primary) hypertension: Secondary | ICD-10-CM | POA: Diagnosis not present

## 2023-06-28 DIAGNOSIS — E7849 Other hyperlipidemia: Secondary | ICD-10-CM | POA: Diagnosis not present

## 2023-07-02 DIAGNOSIS — E039 Hypothyroidism, unspecified: Secondary | ICD-10-CM | POA: Diagnosis not present

## 2023-07-02 DIAGNOSIS — I10 Essential (primary) hypertension: Secondary | ICD-10-CM | POA: Diagnosis not present

## 2023-07-02 DIAGNOSIS — M130 Polyarthritis, unspecified: Secondary | ICD-10-CM | POA: Diagnosis not present

## 2023-07-28 ENCOUNTER — Other Ambulatory Visit (HOSPITAL_COMMUNITY): Payer: Self-pay | Admitting: Family Medicine

## 2023-07-28 DIAGNOSIS — Z1231 Encounter for screening mammogram for malignant neoplasm of breast: Secondary | ICD-10-CM

## 2023-08-09 ENCOUNTER — Ambulatory Visit (HOSPITAL_COMMUNITY)
Admission: RE | Admit: 2023-08-09 | Discharge: 2023-08-09 | Disposition: A | Discharge status: Home / Self Care | Payer: Medicare HMO | Source: Ambulatory Visit | Attending: Family Medicine | Admitting: Family Medicine

## 2023-08-09 DIAGNOSIS — I10 Essential (primary) hypertension: Secondary | ICD-10-CM | POA: Diagnosis not present

## 2023-08-09 DIAGNOSIS — E032 Hypothyroidism due to medicaments and other exogenous substances: Secondary | ICD-10-CM | POA: Diagnosis not present

## 2023-08-09 DIAGNOSIS — Z1231 Encounter for screening mammogram for malignant neoplasm of breast: Secondary | ICD-10-CM | POA: Insufficient documentation

## 2023-08-13 DIAGNOSIS — E039 Hypothyroidism, unspecified: Secondary | ICD-10-CM | POA: Diagnosis not present

## 2023-08-13 DIAGNOSIS — I10 Essential (primary) hypertension: Secondary | ICD-10-CM | POA: Diagnosis not present

## 2023-09-27 DIAGNOSIS — M130 Polyarthritis, unspecified: Secondary | ICD-10-CM | POA: Diagnosis not present

## 2023-09-27 DIAGNOSIS — I10 Essential (primary) hypertension: Secondary | ICD-10-CM | POA: Diagnosis not present

## 2023-09-27 DIAGNOSIS — E7849 Other hyperlipidemia: Secondary | ICD-10-CM | POA: Diagnosis not present

## 2023-09-27 DIAGNOSIS — E039 Hypothyroidism, unspecified: Secondary | ICD-10-CM | POA: Diagnosis not present

## 2023-10-01 DIAGNOSIS — M130 Polyarthritis, unspecified: Secondary | ICD-10-CM | POA: Diagnosis not present

## 2023-10-01 DIAGNOSIS — E039 Hypothyroidism, unspecified: Secondary | ICD-10-CM | POA: Diagnosis not present

## 2023-10-01 DIAGNOSIS — R634 Abnormal weight loss: Secondary | ICD-10-CM | POA: Diagnosis not present

## 2023-10-01 DIAGNOSIS — I10 Essential (primary) hypertension: Secondary | ICD-10-CM | POA: Diagnosis not present

## 2023-10-01 DIAGNOSIS — F5102 Adjustment insomnia: Secondary | ICD-10-CM | POA: Diagnosis not present

## 2023-10-01 DIAGNOSIS — K027 Dental root caries: Secondary | ICD-10-CM | POA: Diagnosis not present

## 2023-11-29 DIAGNOSIS — I10 Essential (primary) hypertension: Secondary | ICD-10-CM | POA: Diagnosis not present

## 2023-11-29 DIAGNOSIS — E559 Vitamin D deficiency, unspecified: Secondary | ICD-10-CM | POA: Diagnosis not present

## 2023-11-29 DIAGNOSIS — E782 Mixed hyperlipidemia: Secondary | ICD-10-CM | POA: Diagnosis not present

## 2023-11-29 DIAGNOSIS — E039 Hypothyroidism, unspecified: Secondary | ICD-10-CM | POA: Diagnosis not present

## 2023-12-03 DIAGNOSIS — E7849 Other hyperlipidemia: Secondary | ICD-10-CM | POA: Diagnosis not present

## 2023-12-03 DIAGNOSIS — I10 Essential (primary) hypertension: Secondary | ICD-10-CM | POA: Diagnosis not present

## 2023-12-03 DIAGNOSIS — I7300 Raynaud's syndrome without gangrene: Secondary | ICD-10-CM | POA: Diagnosis not present

## 2023-12-03 DIAGNOSIS — E039 Hypothyroidism, unspecified: Secondary | ICD-10-CM | POA: Diagnosis not present

## 2024-02-28 DIAGNOSIS — E7800 Pure hypercholesterolemia, unspecified: Secondary | ICD-10-CM | POA: Diagnosis not present

## 2024-02-28 DIAGNOSIS — E039 Hypothyroidism, unspecified: Secondary | ICD-10-CM | POA: Diagnosis not present

## 2024-02-28 DIAGNOSIS — I10 Essential (primary) hypertension: Secondary | ICD-10-CM | POA: Diagnosis not present

## 2024-02-28 DIAGNOSIS — I7300 Raynaud's syndrome without gangrene: Secondary | ICD-10-CM | POA: Diagnosis not present

## 2024-03-03 ENCOUNTER — Other Ambulatory Visit (HOSPITAL_COMMUNITY): Payer: Self-pay | Admitting: Family Medicine

## 2024-03-03 DIAGNOSIS — E039 Hypothyroidism, unspecified: Secondary | ICD-10-CM | POA: Diagnosis not present

## 2024-03-03 DIAGNOSIS — F4322 Adjustment disorder with anxiety: Secondary | ICD-10-CM | POA: Diagnosis not present

## 2024-03-03 DIAGNOSIS — Z13820 Encounter for screening for osteoporosis: Secondary | ICD-10-CM

## 2024-03-03 DIAGNOSIS — I10 Essential (primary) hypertension: Secondary | ICD-10-CM | POA: Diagnosis not present

## 2024-03-03 DIAGNOSIS — R634 Abnormal weight loss: Secondary | ICD-10-CM | POA: Diagnosis not present

## 2024-03-06 ENCOUNTER — Ambulatory Visit (HOSPITAL_COMMUNITY)
Admission: RE | Admit: 2024-03-06 | Discharge: 2024-03-06 | Disposition: A | Discharge status: Home / Self Care | Source: Ambulatory Visit | Attending: Family Medicine | Admitting: Family Medicine

## 2024-03-06 DIAGNOSIS — Z13820 Encounter for screening for osteoporosis: Secondary | ICD-10-CM | POA: Diagnosis not present

## 2024-03-06 DIAGNOSIS — Z780 Asymptomatic menopausal state: Secondary | ICD-10-CM | POA: Insufficient documentation

## 2024-03-06 DIAGNOSIS — M810 Age-related osteoporosis without current pathological fracture: Secondary | ICD-10-CM | POA: Insufficient documentation

## 2024-06-06 DIAGNOSIS — F332 Major depressive disorder, recurrent severe without psychotic features: Secondary | ICD-10-CM | POA: Diagnosis not present

## 2024-06-06 DIAGNOSIS — E559 Vitamin D deficiency, unspecified: Secondary | ICD-10-CM | POA: Diagnosis not present

## 2024-06-06 DIAGNOSIS — R634 Abnormal weight loss: Secondary | ICD-10-CM | POA: Diagnosis not present

## 2024-06-06 DIAGNOSIS — E039 Hypothyroidism, unspecified: Secondary | ICD-10-CM | POA: Diagnosis not present

## 2024-06-09 DIAGNOSIS — M810 Age-related osteoporosis without current pathological fracture: Secondary | ICD-10-CM | POA: Diagnosis not present

## 2024-06-09 DIAGNOSIS — E039 Hypothyroidism, unspecified: Secondary | ICD-10-CM | POA: Diagnosis not present

## 2024-06-09 DIAGNOSIS — I10 Essential (primary) hypertension: Secondary | ICD-10-CM | POA: Diagnosis not present

## 2024-06-15 ENCOUNTER — Other Ambulatory Visit: Payer: Self-pay | Admitting: Internal Medicine

## 2024-06-16 ENCOUNTER — Telehealth (HOSPITAL_COMMUNITY): Payer: Self-pay

## 2024-06-16 ENCOUNTER — Other Ambulatory Visit: Payer: Self-pay | Admitting: Family Medicine

## 2024-06-16 DIAGNOSIS — M810 Age-related osteoporosis without current pathological fracture: Secondary | ICD-10-CM | POA: Insufficient documentation

## 2024-06-16 NOTE — Telephone Encounter (Signed)
 Auth Submission: NO AUTH NEEDED Site of care: Site of care: [facility] Payer: [employer] [place]  Medication & CPT/J Code(s) submitted: Reclast (Zolendronic acid) [id number] Diagnosis Code: M81.0 Route of submission (phone, fax, portal):  Phone # Fax # Auth type: Buy/Bill PB Units/visits requested: 5mg  x 1 dose Reference number:  Approval from: 06/16/24 to 08/23/24

## 2024-06-23 ENCOUNTER — Other Ambulatory Visit: Payer: Self-pay | Admitting: *Deleted

## 2024-06-23 DIAGNOSIS — M79606 Pain in leg, unspecified: Secondary | ICD-10-CM

## 2024-06-26 NOTE — Progress Notes (Unsigned)
 Patient name: Jennifer Mullen MRN: [id number] DOB: 04-27-1946 Sex: female  REASON FOR CONSULT: PAD with claudication  HPI: Jennifer Mullen is a 78 y.o. female, with history of hypertension hyperlipidemia that presents for evaluation of PAD with claudication.  Patient is referred by Dr. Benjamine where she was evaluated for leg pain walking.  Past Medical History:  Diagnosis Date   Gout    Hyperchloremia    Hypercholesteremia    Hypertension    Hypothyroidism    Syncope    Thyroid  disease     Past Surgical History:  Procedure Laterality Date   ABDOMINAL HYSTERECTOMY     APPENDECTOMY     CATARACT EXTRACTION W/PHACO Right 08/04/2021   Procedure: CATARACT EXTRACTION RIGHT EYE W/ PHACO AND INTRAOCULAR LENS PLACEMENT (IOC);  Surgeon: Harrie Agent, MD;  Location: [facility];  Service: Ophthalmology;  Laterality: Right;  CDE: 24.30    CATARACT EXTRACTION W/PHACO Left 08/29/2021   Procedure: CATARACT EXTRACTION PHACO AND INTRAOCULAR LENS PLACEMENT (IOC);  Surgeon: Harrie Agent, MD;  Location: [facility];  Service: Ophthalmology;  Laterality: Left;  CDE: 16.54    CHOLECYSTECTOMY     TUBAL LIGATION      Family History  Problem Relation Age of Onset   Leukemia Mother    Heart Problems Father    Diabetes Son     SOCIAL HISTORY: Social History   Socioeconomic History   Marital status: Divorced    Spouse name: Not on file   Number of children: Not on file   Years of education: Not on file   Highest education level: Not on file  Occupational History   Not on file  Tobacco Use   Smoking status: Never   Smokeless tobacco: Never  Vaping Use   Vaping status: Never Used  Substance and Sexual Activity   Alcohol use: No   Drug use: No   Sexual activity: Yes  Other Topics Concern   Not on file  Social History Narrative   Not on file   Social Drivers of Health   Financial Resource Strain: Not on file  Food Insecurity: Not on file  Transportation Needs: Not on file  Physical  Activity: Not on file  Stress: Not on file  Social Connections: Not on file  Intimate Partner Violence: Not on file    No Known Allergies  Current Outpatient Medications  Medication Sig Dispense Refill   Acetaminophen  (TYLENOL  PO) Take by mouth as needed.     amLODipine  (NORVASC ) 2.5 MG tablet Take 1 tablet (2.5 mg total) by mouth daily. 90 tablet 3   CRESTOR 20 MG tablet Take 1 tablet by mouth daily.     cyclobenzaprine (FLEXERIL) 10 MG tablet Take 10 mg by mouth 3 (three) times daily.     HYDROcodone -acetaminophen  (NORCO) 5-325 MG tablet Take 1-2 tablets by mouth every 6 (six) hours as needed. 10 tablet 0   hydroxychloroquine  (PLAQUENIL ) 200 MG tablet Take 1 tablet (200 mg total) by mouth daily. 30 tablet 0   levothyroxine (SYNTHROID) 112 MCG tablet Take 112 mcg by mouth daily before breakfast.     SYNTHROID 50 MCG tablet every other day. Alternating with Synthroid 75mcg every other day  2   SYNTHROID 75 MCG tablet Take 75 mcg by mouth every other day.      No current facility-administered medications for this visit.    REVIEW OF SYSTEMS:  [X]  denotes positive finding, [ ]  denotes negative finding Cardiac  Comments:  Chest pain or chest  pressure: ***   Shortness of breath upon exertion:    Short of breath when lying flat:    Irregular heart rhythm:        Vascular    Pain in calf, thigh, or hip brought on by ambulation:    Pain in feet at night that wakes you up from your sleep:     Blood clot in your veins:    Leg swelling:         Pulmonary    Oxygen  at home:    Productive cough:     Wheezing:         Neurologic    Sudden weakness in arms or legs:     Sudden numbness in arms or legs:     Sudden onset of difficulty speaking or slurred speech:    Temporary loss of vision in one eye:     Problems with dizziness:         Gastrointestinal    Blood in stool:     Vomited blood:         Genitourinary    Burning when urinating:     Blood in urine:         Psychiatric    Major depression:         Hematologic    Bleeding problems:    Problems with blood clotting too easily:        Skin    Rashes or ulcers:        Constitutional    Fever or chills:      PHYSICAL EXAM: There were no vitals filed for this visit.  GENERAL: The patient is a well-nourished female, in no acute distress. The vital signs are documented above. CARDIAC: There is a regular rate and rhythm.  VASCULAR: *** PULMONARY: There is good air exchange bilaterally without wheezing or rales. ABDOMEN: Soft and non-tender with normal pitched bowel sounds.  MUSCULOSKELETAL: There are no major deformities or cyanosis. NEUROLOGIC: No focal weakness or paresthesias are detected. SKIN: There are no ulcers or rashes noted. PSYCHIATRIC: The patient has a normal affect.  DATA:   ABI  Assessment/Plan:   78 y.o. female, with history of hypertension hyperlipidemia that presents for evaluation of PAD with claudication.  Patient is referred by Dr. Benjamine where she was evaluated for leg pain walking.   Lonni DOROTHA Gaskins, MD [facility] Office: [phone]

## 2024-06-27 ENCOUNTER — Encounter: Payer: Self-pay | Admitting: Vascular Surgery

## 2024-06-27 ENCOUNTER — Ambulatory Visit (INDEPENDENT_AMBULATORY_CARE_PROVIDER_SITE_OTHER)

## 2024-06-27 ENCOUNTER — Ambulatory Visit: Admitting: Vascular Surgery

## 2024-06-27 VITALS — BP 149/80 | HR 74 | Temp 98.0°F | Resp 18 | Ht 63.0 in | Wt 106.0 lb

## 2024-06-27 DIAGNOSIS — M79606 Pain in leg, unspecified: Secondary | ICD-10-CM

## 2024-06-27 DIAGNOSIS — M79604 Pain in right leg: Secondary | ICD-10-CM | POA: Diagnosis not present

## 2024-06-27 DIAGNOSIS — M79605 Pain in left leg: Secondary | ICD-10-CM

## 2024-06-27 LAB — VAS US ABI WITH/WO TBI
Left ABI: 1.22
Right ABI: 1.26

## 2024-06-29 DIAGNOSIS — Z809 Family history of malignant neoplasm, unspecified: Secondary | ICD-10-CM | POA: Diagnosis not present

## 2024-06-29 DIAGNOSIS — Z833 Family history of diabetes mellitus: Secondary | ICD-10-CM | POA: Diagnosis not present

## 2024-06-29 DIAGNOSIS — Z79890 Hormone replacement therapy (postmenopausal): Secondary | ICD-10-CM | POA: Diagnosis not present

## 2024-06-29 DIAGNOSIS — M810 Age-related osteoporosis without current pathological fracture: Secondary | ICD-10-CM | POA: Diagnosis not present

## 2024-06-29 DIAGNOSIS — E039 Hypothyroidism, unspecified: Secondary | ICD-10-CM | POA: Diagnosis not present

## 2024-06-29 DIAGNOSIS — H9192 Unspecified hearing loss, left ear: Secondary | ICD-10-CM | POA: Diagnosis not present

## 2024-06-29 DIAGNOSIS — E785 Hyperlipidemia, unspecified: Secondary | ICD-10-CM | POA: Diagnosis not present

## 2024-06-29 DIAGNOSIS — G4700 Insomnia, unspecified: Secondary | ICD-10-CM | POA: Diagnosis not present

## 2024-07-06 ENCOUNTER — Ambulatory Visit

## 2024-07-13 ENCOUNTER — Encounter: Attending: Family Medicine | Admitting: *Deleted

## 2024-07-13 VITALS — BP 168/64 | HR 59 | Temp 97.8°F | Resp 16

## 2024-07-13 DIAGNOSIS — M810 Age-related osteoporosis without current pathological fracture: Secondary | ICD-10-CM | POA: Diagnosis not present

## 2024-07-13 MED ORDER — DIPHENHYDRAMINE HCL 25 MG PO CAPS
25.0000 mg | ORAL_CAPSULE | Freq: Once | ORAL | Status: AC
  Administered 2024-07-13: 25 mg via ORAL

## 2024-07-13 MED ORDER — ZOLEDRONIC ACID 5 MG/100ML IV SOLN
5.0000 mg | Freq: Once | INTRAVENOUS | Status: AC
  Administered 2024-07-13: 5 mg via INTRAVENOUS

## 2024-07-13 MED ORDER — ACETAMINOPHEN 325 MG PO TABS
650.0000 mg | ORAL_TABLET | Freq: Once | ORAL | Status: AC
  Administered 2024-07-13: 650 mg via ORAL

## 2024-07-13 NOTE — Progress Notes (Signed)
 Diagnosis: Osteoporosis  Provider:  Kennieth Leech, MD  Procedure: IV Infusion  IV Type: Peripheral, IV Location: L Antecubital  Reclast (Zolendronic Acid), Dose: 5 mg  Infusion Start Time: 0940  Infusion Stop Time: 1013  Post Infusion IV Care: Observation period completed  Discharge: Condition: Good, Destination: Home . AVS Provided  Performed by:  Baldwin Darice Helling, RN

## 2025-07-16 ENCOUNTER — Ambulatory Visit
